# Patient Record
Sex: Female | Born: 1975 | Race: Black or African American | Hispanic: No | Marital: Married | Smoking: Current every day smoker
Health system: Southern US, Community
[De-identification: ages and names within clinical notes are randomized; demographics above are authoritative.]

## PROBLEM LIST (undated history)

## (undated) ENCOUNTER — Inpatient Hospital Stay: Payer: Self-pay

## (undated) ENCOUNTER — Inpatient Hospital Stay (HOSPITAL_COMMUNITY): Payer: Self-pay

## (undated) DIAGNOSIS — N390 Urinary tract infection, site not specified: Secondary | ICD-10-CM

## (undated) DIAGNOSIS — D689 Coagulation defect, unspecified: Secondary | ICD-10-CM

## (undated) DIAGNOSIS — D649 Anemia, unspecified: Secondary | ICD-10-CM

## (undated) DIAGNOSIS — I82409 Acute embolism and thrombosis of unspecified deep veins of unspecified lower extremity: Secondary | ICD-10-CM

## (undated) DIAGNOSIS — F309 Manic episode, unspecified: Secondary | ICD-10-CM

## (undated) HISTORY — PX: TONSILLECTOMY: SUR1361

## (undated) HISTORY — DX: Coagulation defect, unspecified: D68.9

## (undated) HISTORY — DX: Anemia, unspecified: D64.9

---

## 1998-06-09 HISTORY — PX: BREAST RECONSTRUCTION: SHX9

## 2012-03-30 ENCOUNTER — Emergency Department: Payer: Self-pay | Admitting: Emergency Medicine

## 2012-03-30 LAB — DRUG SCREEN, URINE
Amphetamines, Ur Screen: NEGATIVE (ref ?–1000)
Barbiturates, Ur Screen: NEGATIVE (ref ?–200)
Benzodiazepine, Ur Scrn: NEGATIVE (ref ?–200)
Cannabinoid 50 Ng, Ur ~~LOC~~: NEGATIVE (ref ?–50)
Cocaine Metabolite,Ur ~~LOC~~: NEGATIVE (ref ?–300)
Methadone, Ur Screen: NEGATIVE (ref ?–300)
Opiate, Ur Screen: NEGATIVE (ref ?–300)
Phencyclidine (PCP) Ur S: NEGATIVE (ref ?–25)
Tricyclic, Ur Screen: NEGATIVE (ref ?–1000)

## 2012-03-30 LAB — COMPREHENSIVE METABOLIC PANEL
Albumin: 4.3 g/dL (ref 3.4–5.0)
Alkaline Phosphatase: 80 U/L (ref 50–136)
BUN: 8 mg/dL (ref 7–18)
Bilirubin,Total: 0.3 mg/dL (ref 0.2–1.0)
Calcium, Total: 8.6 mg/dL (ref 8.5–10.1)
Chloride: 109 mmol/L — ABNORMAL HIGH (ref 98–107)
EGFR (African American): >60
Glucose: 77 mg/dL (ref 65–99)
Potassium: 3.5 mmol/L (ref 3.5–5.1)
SGOT(AST): 29 U/L (ref 15–37)
SGPT (ALT): 18 U/L (ref 12–78)
Sodium: 144 mmol/L (ref 136–145)
Total Protein: 8 g/dL (ref 6.4–8.2)

## 2012-03-30 LAB — ACETAMINOPHEN LEVEL: Acetaminophen: <2 ug/mL

## 2012-03-30 LAB — CBC
HGB: 13.6 g/dL (ref 12.0–16.0)
MCH: 33.3 pg (ref 26.0–34.0)
MCHC: 34.2 g/dL (ref 32.0–36.0)
MCV: 97 fL (ref 80–100)
Platelet: 204 10*3/uL (ref 150–440)

## 2012-03-30 LAB — TSH: Thyroid Stimulating Horm: 2.81 u[IU]/mL

## 2012-06-28 ENCOUNTER — Emergency Department: Payer: Self-pay | Admitting: Emergency Medicine

## 2012-10-07 ENCOUNTER — Ambulatory Visit: Payer: Self-pay | Admitting: Oncology

## 2012-10-23 ENCOUNTER — Inpatient Hospital Stay: Payer: Self-pay | Admitting: Internal Medicine

## 2012-10-23 LAB — CBC
HCT: 36.8 % (ref 35.0–47.0)
HGB: 12.5 g/dL (ref 12.0–16.0)
MCH: 34 pg (ref 26.0–34.0)
MCHC: 34.1 g/dL (ref 32.0–36.0)
MCV: 100 fL (ref 80–100)
RDW: 12.7 % (ref 11.5–14.5)
WBC: 8.9 10*3/uL (ref 3.6–11.0)

## 2012-10-23 LAB — COMPREHENSIVE METABOLIC PANEL
Albumin: 4.1 g/dL (ref 3.4–5.0)
Anion Gap: 13 (ref 7–16)
BUN: 10 mg/dL (ref 7–18)
Calcium, Total: 9.5 mg/dL (ref 8.5–10.1)
Co2: 24 mmol/L (ref 21–32)
Creatinine: 0.95 mg/dL (ref 0.60–1.30)
EGFR (African American): >60
Glucose: 99 mg/dL (ref 65–99)
Potassium: 3.6 mmol/L (ref 3.5–5.1)
SGOT(AST): 55 U/L — ABNORMAL HIGH (ref 15–37)
Sodium: 135 mmol/L — ABNORMAL LOW (ref 136–145)
Total Protein: 8.6 g/dL — ABNORMAL HIGH (ref 6.4–8.2)

## 2012-10-23 LAB — PROTIME-INR: Prothrombin Time: 14.4 secs (ref 11.5–14.7)

## 2012-10-23 LAB — TROPONIN I: Troponin-I: <0.02 ng/mL

## 2012-10-24 LAB — LIPID PANEL
Cholesterol: 140 mg/dL (ref 0–200)
VLDL Cholesterol, Calc: 28 mg/dL (ref 5–40)

## 2012-10-26 LAB — PROTIME-INR: INR: 1.2

## 2012-10-28 ENCOUNTER — Emergency Department: Payer: Self-pay | Admitting: Emergency Medicine

## 2012-10-28 LAB — CBC
HGB: 10.2 g/dL — ABNORMAL LOW (ref 12.0–16.0)
MCHC: 33.7 g/dL (ref 32.0–36.0)
MCV: 98 fL (ref 80–100)
Platelet: 379 10*3/uL (ref 150–440)
RBC: 3.07 10*6/uL — ABNORMAL LOW (ref 3.80–5.20)
RDW: 13 % (ref 11.5–14.5)

## 2012-10-28 LAB — PROTIME-INR: INR: 1.2

## 2012-10-28 LAB — BASIC METABOLIC PANEL
Anion Gap: 4 — ABNORMAL LOW (ref 7–16)
BUN: 7 mg/dL (ref 7–18)
Calcium, Total: 8.9 mg/dL (ref 8.5–10.1)
Chloride: 106 mmol/L (ref 98–107)
Co2: 28 mmol/L (ref 21–32)
Creatinine: 0.67 mg/dL (ref 0.60–1.30)
EGFR (African American): >60
Glucose: 121 mg/dL — ABNORMAL HIGH (ref 65–99)
Potassium: 3.7 mmol/L (ref 3.5–5.1)

## 2012-11-07 ENCOUNTER — Ambulatory Visit: Payer: Self-pay | Admitting: Oncology

## 2012-11-24 ENCOUNTER — Emergency Department: Payer: Self-pay | Admitting: Emergency Medicine

## 2012-11-24 LAB — URINALYSIS, COMPLETE
Bilirubin,UR: NEGATIVE
Glucose,UR: NEGATIVE mg/dL (ref 0–75)
Ketone: NEGATIVE
Leukocyte Esterase: NEGATIVE
Ph: 6 (ref 4.5–8.0)
Specific Gravity: 1.011 (ref 1.003–1.030)
Squamous Epithelial: 24
WBC UR: 2 /HPF (ref 0–5)

## 2012-11-24 LAB — COMPREHENSIVE METABOLIC PANEL
Alkaline Phosphatase: 110 U/L (ref 50–136)
BUN: 7 mg/dL (ref 7–18)
Bilirubin,Total: 0.4 mg/dL (ref 0.2–1.0)
Calcium, Total: 9.7 mg/dL (ref 8.5–10.1)
Chloride: 104 mmol/L (ref 98–107)
Co2: 29 mmol/L (ref 21–32)
EGFR (African American): >60
EGFR (Non-African Amer.): >60
Glucose: 78 mg/dL (ref 65–99)
Osmolality: 273 (ref 275–301)
Potassium: 3.4 mmol/L — ABNORMAL LOW (ref 3.5–5.1)
SGPT (ALT): 19 U/L (ref 12–78)
Sodium: 138 mmol/L (ref 136–145)
Total Protein: 8.5 g/dL — ABNORMAL HIGH (ref 6.4–8.2)

## 2012-11-24 LAB — CBC WITH DIFFERENTIAL/PLATELET
Eosinophil #: 0.1 10*3/uL (ref 0.0–0.7)
Eosinophil %: 2.1 %
HGB: 13.3 g/dL (ref 12.0–16.0)
Lymphocyte #: 1.2 10*3/uL (ref 1.0–3.6)
MCH: 32.2 pg (ref 26.0–34.0)
MCHC: 33.5 g/dL (ref 32.0–36.0)
MCV: 96 fL (ref 80–100)
Monocyte %: 8.5 %
Neutrophil #: 1.3 10*3/uL — ABNORMAL LOW (ref 1.4–6.5)
Neutrophil %: 46 %
Platelet: 184 10*3/uL (ref 150–440)
RBC: 4.11 10*6/uL (ref 3.80–5.20)
RDW: 14.1 % (ref 11.5–14.5)
WBC: 2.8 10*3/uL — ABNORMAL LOW (ref 3.6–11.0)

## 2012-11-24 LAB — PROTIME-INR: Prothrombin Time: 89.9 secs — ABNORMAL HIGH (ref 11.5–14.7)

## 2012-11-26 ENCOUNTER — Emergency Department: Payer: Self-pay | Admitting: Emergency Medicine

## 2012-11-26 LAB — PROTIME-INR
INR: 3.8
Prothrombin Time: 36 secs — ABNORMAL HIGH (ref 11.5–14.7)

## 2013-03-01 ENCOUNTER — Emergency Department: Payer: Self-pay | Admitting: Internal Medicine

## 2013-12-07 HISTORY — PX: DILATION AND CURETTAGE OF UTERUS: SHX78

## 2013-12-14 ENCOUNTER — Emergency Department: Payer: Self-pay | Admitting: Internal Medicine

## 2013-12-14 LAB — BASIC METABOLIC PANEL
Anion Gap: 9 (ref 7–16)
BUN: 10 mg/dL (ref 7–18)
CHLORIDE: 103 mmol/L (ref 98–107)
Calcium, Total: 8.7 mg/dL (ref 8.5–10.1)
Co2: 26 mmol/L (ref 21–32)
Creatinine: 0.75 mg/dL (ref 0.60–1.30)
EGFR (African American): >60
GLUCOSE: 79 mg/dL (ref 65–99)
OSMOLALITY: 274 (ref 275–301)
POTASSIUM: 3.7 mmol/L (ref 3.5–5.1)
SODIUM: 138 mmol/L (ref 136–145)

## 2013-12-14 LAB — HCG, QUANTITATIVE, PREGNANCY: BETA HCG, QUANT.: 54721 m[IU]/mL — AB

## 2013-12-14 LAB — CBC
HCT: 34 % — AB (ref 35.0–47.0)
HGB: 11.1 g/dL — ABNORMAL LOW (ref 12.0–16.0)
MCH: 31.9 pg (ref 26.0–34.0)
MCHC: 32.7 g/dL (ref 32.0–36.0)
MCV: 98 fL (ref 80–100)
PLATELETS: 224 10*3/uL (ref 150–440)
RBC: 3.48 10*6/uL — ABNORMAL LOW (ref 3.80–5.20)
RDW: 13.2 % (ref 11.5–14.5)
WBC: 5.1 10*3/uL (ref 3.6–11.0)

## 2014-01-07 DIAGNOSIS — I82409 Acute embolism and thrombosis of unspecified deep veins of unspecified lower extremity: Secondary | ICD-10-CM

## 2014-01-07 HISTORY — DX: Acute embolism and thrombosis of unspecified deep veins of unspecified lower extremity: I82.409

## 2014-02-03 ENCOUNTER — Emergency Department (HOSPITAL_COMMUNITY): Payer: Medicare Other

## 2014-02-03 ENCOUNTER — Encounter (HOSPITAL_COMMUNITY): Payer: Self-pay | Admitting: Emergency Medicine

## 2014-02-03 ENCOUNTER — Inpatient Hospital Stay (HOSPITAL_COMMUNITY)
Admission: EM | Admit: 2014-02-03 | Discharge: 2014-02-05 | DRG: 300 | Disposition: A | Discharge status: Home / Self Care | Payer: Medicare Other | Attending: Internal Medicine | Admitting: Internal Medicine

## 2014-02-03 DIAGNOSIS — Z791 Long term (current) use of non-steroidal anti-inflammatories (NSAID): Secondary | ICD-10-CM

## 2014-02-03 DIAGNOSIS — N390 Urinary tract infection, site not specified: Secondary | ICD-10-CM | POA: Diagnosis present

## 2014-02-03 DIAGNOSIS — M79609 Pain in unspecified limb: Secondary | ICD-10-CM | POA: Diagnosis not present

## 2014-02-03 DIAGNOSIS — M7989 Other specified soft tissue disorders: Secondary | ICD-10-CM | POA: Diagnosis not present

## 2014-02-03 DIAGNOSIS — F39 Unspecified mood [affective] disorder: Secondary | ICD-10-CM | POA: Diagnosis present

## 2014-02-03 DIAGNOSIS — F411 Generalized anxiety disorder: Secondary | ICD-10-CM | POA: Diagnosis present

## 2014-02-03 DIAGNOSIS — B961 Klebsiella pneumoniae [K. pneumoniae] as the cause of diseases classified elsewhere: Secondary | ICD-10-CM | POA: Diagnosis present

## 2014-02-03 DIAGNOSIS — I82409 Acute embolism and thrombosis of unspecified deep veins of unspecified lower extremity: Principal | ICD-10-CM | POA: Diagnosis present

## 2014-02-03 DIAGNOSIS — F17200 Nicotine dependence, unspecified, uncomplicated: Secondary | ICD-10-CM | POA: Diagnosis present

## 2014-02-03 DIAGNOSIS — I82403 Acute embolism and thrombosis of unspecified deep veins of lower extremity, bilateral: Secondary | ICD-10-CM

## 2014-02-03 DIAGNOSIS — Z79899 Other long term (current) drug therapy: Secondary | ICD-10-CM | POA: Diagnosis not present

## 2014-02-03 DIAGNOSIS — F4310 Post-traumatic stress disorder, unspecified: Secondary | ICD-10-CM | POA: Diagnosis present

## 2014-02-03 DIAGNOSIS — F410 Panic disorder [episodic paroxysmal anxiety] without agoraphobia: Secondary | ICD-10-CM | POA: Diagnosis present

## 2014-02-03 HISTORY — DX: Acute embolism and thrombosis of unspecified deep veins of unspecified lower extremity: I82.409

## 2014-02-03 LAB — CBC WITH DIFFERENTIAL/PLATELET
BASOS ABS: 0 10*3/uL (ref 0.0–0.1)
Basophils Relative: 1 % (ref 0–1)
Eosinophils Absolute: 0.1 10*3/uL (ref 0.0–0.7)
Eosinophils Relative: 2 % (ref 0–5)
HCT: 33.5 % — ABNORMAL LOW (ref 36.0–46.0)
HEMOGLOBIN: 11.2 g/dL — AB (ref 12.0–15.0)
Lymphocytes Relative: 49 % — ABNORMAL HIGH (ref 12–46)
Lymphs Abs: 2 10*3/uL (ref 0.7–4.0)
MCH: 31.1 pg (ref 26.0–34.0)
MCHC: 33.4 g/dL (ref 30.0–36.0)
MCV: 93.1 fL (ref 78.0–100.0)
Monocytes Absolute: 0.5 10*3/uL (ref 0.1–1.0)
Monocytes Relative: 11 % (ref 3–12)
Neutro Abs: 1.6 10*3/uL — ABNORMAL LOW (ref 1.7–7.7)
Neutrophils Relative %: 37 % — ABNORMAL LOW (ref 43–77)
Platelets: 308 10*3/uL (ref 150–400)
RBC: 3.6 MIL/uL — ABNORMAL LOW (ref 3.87–5.11)
RDW: 14.4 % (ref 11.5–15.5)
WBC: 4.2 10*3/uL (ref 4.0–10.5)

## 2014-02-03 LAB — BASIC METABOLIC PANEL
ANION GAP: 15 (ref 5–15)
BUN: 7 mg/dL (ref 6–23)
CALCIUM: 9.2 mg/dL (ref 8.4–10.5)
CHLORIDE: 103 meq/L (ref 96–112)
CO2: 22 meq/L (ref 19–32)
Creatinine, Ser: 0.73 mg/dL (ref 0.50–1.10)
GFR calc Af Amer: >90 mL/min (ref >90)
GFR calc non Af Amer: >90 mL/min (ref >90)
Glucose, Bld: 110 mg/dL — ABNORMAL HIGH (ref 70–99)
POTASSIUM: 4 meq/L (ref 3.7–5.3)
SODIUM: 140 meq/L (ref 137–147)

## 2014-02-03 LAB — I-STAT TROPONIN, ED: Troponin i, poc: 0 ng/mL (ref 0.00–0.08)

## 2014-02-03 LAB — PROTIME-INR
INR: 1.19 (ref 0.00–1.49)
Prothrombin Time: 15.1 seconds (ref 11.6–15.2)

## 2014-02-03 LAB — APTT: aPTT: 33 seconds (ref 24–37)

## 2014-02-03 MED ORDER — MIRTAZAPINE 15 MG PO TABS
15.0000 mg | ORAL_TABLET | Freq: Every day | ORAL | Status: DC
  Administered 2014-02-04 – 2014-02-05 (×2): 15 mg via ORAL
  Filled 2014-02-03 (×3): qty 1

## 2014-02-03 MED ORDER — HYDROXYZINE HCL 50 MG PO TABS
50.0000 mg | ORAL_TABLET | Freq: Three times a day (TID) | ORAL | Status: DC | PRN
  Administered 2014-02-04 – 2014-02-05 (×2): 50 mg via ORAL
  Filled 2014-02-03 (×4): qty 1

## 2014-02-03 MED ORDER — PRAZOSIN HCL 2 MG PO CAPS
2.0000 mg | ORAL_CAPSULE | Freq: Every day | ORAL | Status: DC
  Administered 2014-02-04 – 2014-02-05 (×2): 2 mg via ORAL
  Filled 2014-02-03 (×3): qty 1

## 2014-02-03 MED ORDER — OXYCODONE HCL 5 MG PO TABS
5.0000 mg | ORAL_TABLET | ORAL | Status: DC | PRN
  Administered 2014-02-03 – 2014-02-04 (×4): 10 mg via ORAL
  Administered 2014-02-04: 5 mg via ORAL
  Administered 2014-02-04 – 2014-02-05 (×6): 10 mg via ORAL
  Filled 2014-02-03 (×11): qty 2

## 2014-02-03 MED ORDER — ENOXAPARIN SODIUM 60 MG/0.6ML ~~LOC~~ SOLN
60.0000 mg | Freq: Two times a day (BID) | SUBCUTANEOUS | Status: DC
  Administered 2014-02-04 – 2014-02-05 (×4): 60 mg via SUBCUTANEOUS
  Filled 2014-02-03 (×5): qty 0.6

## 2014-02-03 MED ORDER — HEPARIN SODIUM (PORCINE) 5000 UNIT/ML IJ SOLN: 60.0000 [IU]/kg | Freq: Once | INTRAMUSCULAR | Status: DC

## 2014-02-03 MED ORDER — ACETAMINOPHEN 325 MG PO TABS: 650.0000 mg | ORAL_TABLET | Freq: Four times a day (QID) | ORAL | Status: DC | PRN

## 2014-02-03 MED ORDER — IOHEXOL 350 MG/ML SOLN
100.0000 mL | Freq: Once | INTRAVENOUS | Status: AC | PRN
  Administered 2014-02-03: 100 mL via INTRAVENOUS

## 2014-02-03 MED ORDER — SODIUM CHLORIDE 0.9 % IV SOLN
INTRAVENOUS | Status: DC
  Administered 2014-02-03: 16:00:00 via INTRAVENOUS

## 2014-02-03 MED ORDER — DOXEPIN HCL 100 MG PO CAPS
300.0000 mg | ORAL_CAPSULE | Freq: Every day | ORAL | Status: DC
Start: 2014-02-03 — End: 2014-02-05
  Administered 2014-02-04 – 2014-02-05 (×2): 300 mg via ORAL
  Filled 2014-02-03 (×3): qty 3

## 2014-02-03 MED ORDER — ENOXAPARIN SODIUM 60 MG/0.6ML ~~LOC~~ SOLN
60.0000 mg | Freq: Once | SUBCUTANEOUS | Status: AC
  Administered 2014-02-03: 60 mg via SUBCUTANEOUS
  Filled 2014-02-03: qty 0.6

## 2014-02-03 MED ORDER — CLONAZEPAM 1 MG PO TABS
1.0000 mg | ORAL_TABLET | Freq: Two times a day (BID) | ORAL | Status: DC
  Administered 2014-02-04 – 2014-02-05 (×4): 1 mg via ORAL
  Filled 2014-02-03 (×4): qty 1

## 2014-02-03 MED ORDER — NICOTINE 14 MG/24HR TD PT24
14.0000 mg | MEDICATED_PATCH | Freq: Every day | TRANSDERMAL | Status: DC
  Administered 2014-02-03 – 2014-02-04 (×2): 14 mg via TRANSDERMAL
  Filled 2014-02-03 (×2): qty 1

## 2014-02-03 MED ORDER — ONDANSETRON HCL 4 MG PO TABS: 4.0000 mg | ORAL_TABLET | Freq: Four times a day (QID) | ORAL | Status: DC | PRN

## 2014-02-03 MED ORDER — ONDANSETRON HCL 4 MG/2ML IJ SOLN: 4.0000 mg | Freq: Four times a day (QID) | INTRAMUSCULAR | Status: DC | PRN

## 2014-02-03 MED ORDER — HYDROXYZINE PAMOATE 50 MG PO CAPS
50.0000 mg | ORAL_CAPSULE | Freq: Three times a day (TID) | ORAL | Status: DC | PRN
  Filled 2014-02-03: qty 1

## 2014-02-03 MED ORDER — HYDROMORPHONE HCL PF 1 MG/ML IJ SOLN
1.0000 mg | Freq: Once | INTRAMUSCULAR | Status: AC
  Administered 2014-02-03: 1 mg via INTRAVENOUS
  Filled 2014-02-03: qty 1

## 2014-02-03 MED ORDER — SODIUM CHLORIDE 0.9 % IV SOLN: INTRAVENOUS | Status: AC

## 2014-02-03 MED ORDER — MORPHINE SULFATE 4 MG/ML IJ SOLN
4.0000 mg | Freq: Once | INTRAMUSCULAR | Status: DC
  Filled 2014-02-03: qty 1

## 2014-02-03 MED ORDER — ACETAMINOPHEN 650 MG RE SUPP: 650.0000 mg | Freq: Four times a day (QID) | RECTAL | Status: DC | PRN

## 2014-02-03 MED ORDER — HYDROCODONE-ACETAMINOPHEN 5-325 MG PO TABS
1.0000 | ORAL_TABLET | ORAL | Status: DC | PRN
  Administered 2014-02-03: 2 via ORAL
  Filled 2014-02-03: qty 2

## 2014-02-03 NOTE — Progress Notes (Addendum)
ANTICOAGULATION CONSULT NOTE - Initial Consult  Pharmacy Consult for heparin Indication: Acute DVT (with history of DVT)  No Known Allergies  Patient Measurements: Height:  (167.6 cm) Weight: 130 lb (58.968 kg) IBW/kg (Calculated) : 59.3 Heparin Dosing Weight: 59kg  Vital Signs: Temp: 98.2 F (36.8 C) (08/28 1207) Temp src: Oral (08/28 1207) BP: 112/74 mmHg (08/28 1526) Pulse Rate: 100 (08/28 1526)  Labs:  Recent Labs  02/03/14 1517  HGB 11.2*  HCT 33.5*  PLT 308    CrCl is unknown because no creatinine reading has been taken.   Medical History: Past Medical History  Diagnosis Date  . DVT (deep venous thrombosis) 01/2014   Assessment: 38 YOF presents with BL calf pain and found to have BL LE DVT.   She has history of DVTs and discontinued anticoagulation after finding out she was [redacted] weeks pregnant.  She miscarried and had D&C July 14th in [place]. Per patient, she has "intermittently" resumed her apixaban  PO BID following miscarriage. She was recently discharged from a [place], [place] hospital for DVT and drove to [facility] 8/24. Last dose of apixaban on 8/27. Orders for pharmacy to start heparin gtt, now changed to LMWH    CBC acceptable to start heparin  SCr = 0.73, CrCl >64ml/min  Goal of Therapy:  Heparin level 0.3-0.7 units/ml Monitor platelets by anticoagulation protocol: Yes   Plan:   Lovenox  SQ q12h ( /kg)  CBC minimum q72h while in hospital on LMWH  Check baseline PT/INR, aPTT  Signed/held order for eliquis - spoke with Dr. Elisabeth Pigeon - Orders to discontinue  If still trying to become pregnant, LMWH likely drug of choice  Juliette Alcide, PharmD, BCPS.   Pager: [phone]  02/03/2014,3:43 PM

## 2014-02-03 NOTE — ED Notes (Signed)
Pt reports hx of blood clots; was on blood thinners and stopped when she found out she was [redacted] weeks pregnant. Pt report D&C of pregnancy in July. Pt reports recent discharge for DVT bilaterally. Pt reports on blood thinners for past 5-6 days. Pt tearful.

## 2014-02-03 NOTE — H&P (Signed)
[facility] History and Physical  Jaclyn Dougherty:[id number] DOB: 1975-10-06 DOA: 02/03/2014  Referring physician: ER physician PCP: No primary provider on file.   Chief Complaint: calf pain, leg swelling  HPI:  38 year old female with past medical history of right lower extremity DVT in 09/2013 was on anticoagulation but subsequently stopped, then left lower extremity DVT in 11/2013 was on apixaban. Then she found out she was pregnant in 12/2013 and she stopped apixaban. She subsequently has spontaneous abortion. She recently traveled and was sitting for about 4 hours. She noticed her calf muscles were hurting and legs were more swollen. No shortness of breath, no chest pain, no palpitations. No abdominal pain, nausea or vomiting. No blood in stool or urine. No falls or lightheadedness or loss of consciousness. In ED, vitals were stable. She was found to have bilateral DVT. She was started on Lovenox for anticoagulation.   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 Pt mother had history of blood clot first diagnosed at age 38. Pt will need lifelong treatment with anticoagulation.   Now on Lovenox since admission but pt prefers to be on apixaban.  No evidence of pulmonary embolism on CT angio chest    Radiological Exams on Admission: Ct Angio Chest Pe W/cm &/or Wo Cm  02/03/2014    IMPRESSION: No evidence of pulmonary embolus.  No acute findings.     Code Status: Full Family Communication: Plan of care discussed with the patient  Disposition Plan: Admit for further evaluation  Manson Passey, MD  [facility] Pager [phone]  Review of Systems:  Constitutional: Negative for fever, chills and malaise/fatigue. Negative for diaphoresis.  HENT: Negative for hearing loss, ear pain, nosebleeds, congestion, sore throat, neck pain, tinnitus and ear discharge.   Eyes: Negative for blurred vision, double vision,  photophobia, pain, discharge and redness.  Respiratory: Negative for cough, hemoptysis, sputum production, shortness of breath, wheezing and stridor.   Cardiovascular: Negative for chest pain, palpitations, orthopnea, claudication.  Gastrointestinal: Negative for nausea, vomiting and abdominal pain. Negative for heartburn, constipation, blood in stool and melena.  Genitourinary: Negative for dysuria, urgency, frequency, hematuria and flank pain.  Musculoskeletal: Negative for myalgias, back pain, joint pain and falls.  Skin: Negative for itching and rash.  Neurological: Negative for dizziness and weakness. Negative for tingling, tremors, sensory change, speech change, focal weakness, loss of consciousness and headaches.  Endo/Heme/Allergies: Negative for environmental allergies and polydipsia. Does not bruise/bleed easily.  Psychiatric/Behavioral: Negative for suicidal ideas. The patient is not nervous/anxious.      Past Medical History  Diagnosis Date  . DVT (deep venous thrombosis) 01/2014   Past Surgical History  Procedure Laterality Date  . Tonsillectomy    . Breast reconstruction Bilateral 2000    breast augumentation   Social History:  reports that she has been smoking Cigarettes.  She has been smoking about 0.00 packs per day. She does not have any smokeless tobacco history on file. She reports that she drinks alcohol. She reports that she does not use illicit drugs.  No Known Allergies  Family History: mother blood clot at age 7.   Prior to Admission medications   Medication Sig Start Date End Date Taking? Authorizing Provider  apixaban (ELIQUIS) 5 MG TABS tablet Take 5 mg by mouth 2 (two) times daily.   Yes Historical Provider, MD  clonazePAM (KLONOPIN) 1 MG tablet Take 1 mg by mouth 2 (two) times daily.  Yes Historical Provider, MD  doxepin (SINEQUAN) 150 MG capsule Take 300 mg by mouth at bedtime.   Yes Historical Provider, MD  hydrOXYzine (VISTARIL) 50 MG capsule Take  50 mg by mouth 3 (three) times daily as needed for anxiety.   Yes Historical Provider, MD  ibuprofen (ADVIL,MOTRIN) 200 MG tablet Take 600 mg by mouth every 4 (four) hours as needed for moderate pain or cramping.   Yes Historical Provider, MD  mirtazapine (REMERON) 15 MG tablet Take 15 mg by mouth at bedtime.   Yes Historical Provider, MD  prazosin (MINIPRESS) 2 MG capsule Take 2 mg by mouth at bedtime.   Yes Historical Provider, MD   Physical Exam: Filed Vitals:   02/03/14 1452 02/03/14 1526 02/03/14 1701 02/03/14 1758  BP: 124/71 112/74 116/59 126/78  Pulse: 98 100 78 74  Temp:   98.5 F (36.9 C) 98.9 F (37.2 C)  TempSrc:   Oral Oral  Resp: Height:   (1.676 m)   (1.676 m)  Weight:  58.968 kg (130 lb)  58.968 kg (130 lb)  SpO2: 99% 100% 100% 100%    Physical Exam  Constitutional: Appears well-developed and well-nourished. No distress.  HENT: Normocephalic. No tonsillar erythema or exudates Eyes: Conjunctivae and EOM are normal. PERRLA, no scleral icterus.  Neck: Normal ROM. Neck supple. No JVD. No tracheal deviation. No thyromegaly.  CVS: RRR, S1/S2 +, no murmurs, no gallops, no carotid bruit.  Pulmonary: Effort and breath sounds normal, no stridor, rhonchi, wheezes, rales.  Abdominal: Soft. BS +,  no distension, tenderness, rebound or guarding.  Musculoskeletal: Normal range of motion. LE swelling appreciated .  Lymphadenopathy: No lymphadenopathy noted, cervical, inguinal. Neuro: Alert. Normal reflexes, muscle tone coordination. No focal neurologic deficits. Skin: Skin is warm and dry. No rash noted. Not diaphoretic. No erythema. No pallor.  Psychiatric: Normal mood and affect. Behavior, judgment, thought content normal.   Labs on Admission:  Basic Metabolic Panel:  Recent Labs Lab 02/03/14 1517  NA 140  K 4.0  CL 103  CO2 22  GLUCOSE 110*  BUN 7  CREATININE 0.73  CALCIUM 9.2   Liver Function Tests: No results found for this basename: AST,  ALT, ALKPHOS, BILITOT, PROT, ALBUMIN,  in the last 168 hours No results found for this basename: LIPASE, AMYLASE,  in the last 168 hours No results found for this basename: AMMONIA,  in the last 168 hours CBC:  Recent Labs Lab 02/03/14 1517  WBC 4.2  NEUTROABS 1.6*  HGB 11.2*  HCT 33.5*  MCV 93.1  PLT 308   Cardiac Enzymes: No results found for this basename: CKTOTAL, CKMB, CKMBINDEX, TROPONINI,  in the last 168 hours BNP: No components found with this basename: POCBNP,  CBG: No results found for this basename: GLUCAP,  in the last 168 hours  If 7PM-7AM, please contact night-coverage [email or web address] Password [facility] 02/03/2014, 6:07 PM

## 2014-02-03 NOTE — ED Notes (Signed)
Pt is having swelling and pain in left calf. Pt is having pain in right leg behind her knee which is where she states hurts when she has DVT's. Pt has history of DVT and was just discharged 2 days ago in [place]. She made a 9 hour car ride yesterday to come to [facility] from [place]. States she has been having SOB but does not currently have SOB. VS stable.

## 2014-02-03 NOTE — ED Provider Notes (Signed)
CSN: [id number]     Arrival date & time 02/03/14  1150 History   First MD Initiated Contact with Patient 02/03/14 1206     No chief complaint on file.    (Consider location/radiation/quality/duration/timing/severity/associated sxs/prior Treatment) HPI This patient has history of prior DVT.  She was recently hospitalized in [place] for DVT. She drove to the area to visit family. She now has pain behind both knees, right greater than left. However she notes some swelling of the left knee. Patient reports she is taking Eliquis. Patient denies CP, endorses SOB. Patient reports the symptoms are worse than they were previously when she was hospitalized. She reports that she was told to return to the hospital should her symptoms worsen. She denies that she previously had pulmonary embolism in association with her clots. She had a pregnancy in July at which point in time she had stopped taking any anticoagulants. She lost that pregnancy and since that point in time has had intermittent use of anticoagulants. The patient does not know the underlying cause of her clots. She is very tearful and upset and reports that she needs to know why this is happening to her. No past medical history on file. No past surgical history on file. No family history on file. History  Substance Use Topics  . Smoking status: Not on file  . Smokeless tobacco: Not on file  . Alcohol Use: Not on file   OB History   No data available     Review of Systems  Constitutional: Negative for fever and chills.  HENT: Negative for congestion and sore throat.   Respiratory: Positive for shortness of breath. Negative for cough.   Cardiovascular: Negative for chest pain and leg swelling.  Gastrointestinal: Negative for vomiting, abdominal pain and diarrhea.  Genitourinary: Negative for dysuria and difficulty urinating.  Musculoskeletal: Positive for arthralgias and myalgias.  Skin: Negative for color change and rash.   Neurological: Negative for dizziness, weakness and headaches.  Psychiatric/Behavioral: Negative for confusion and agitation.      Allergies  Review of patient's allergies indicates not on file.  Home Medications   Prior to Admission medications   Not on File   There were no vitals taken for this visit. Physical Exam  Constitutional: She is oriented to person, place, and time. She appears well-developed and well-nourished.  HENT:  Head: Normocephalic and atraumatic.  Eyes: EOM are normal. Pupils are equal, round, and reactive to light.  Neck: Neck supple.  Cardiovascular: Normal rate, regular rhythm, normal heart sounds and intact distal pulses.   Pulmonary/Chest: Effort normal and breath sounds normal.  Abdominal: Soft. Bowel sounds are normal. She exhibits no distension. There is no tenderness.  Musculoskeletal: Normal range of motion. She exhibits no edema. Tenderness: endorses tenderness in the popliteal fossa bilaterally. Calves are soft no significant rib tenderness to palpation. possible mild.  Neurological: She is alert and oriented to person, place, and time. She has normal strength. Coordination normal. GCS eye subscore is 4. GCS verbal subscore is 5. GCS motor subscore is 6.  Skin: Skin is warm, dry and intact.  Psychiatric: She has a normal mood and affect.   correction to above that is not supposed to be rib tenderness. There is mild possible effusion of the left knee joint this is not of significant pain the patient she has normal range of motion. There is no erythema or warmth to the joint.   ED Course  Procedures (including critical care time) Labs Review Labs Reviewed  CBC WITH DIFFERENTIAL - Abnormal; Notable for the following:    RBC 3.60 (*)    Hemoglobin 11.2 (*)    HCT 33.5 (*)    Neutrophils Relative % 37 (*)    Neutro Abs 1.6 (*)    Lymphocytes Relative 49 (*)    All other components within normal limits  BASIC METABOLIC PANEL  APTT  PROTIME-INR   I-STAT TROPOININ, ED    Imaging Review No results found.   EKG Interpretation None     CRITICAL CARE Performed by: Arby Barrette   Total critical care time: 30 MIN  Critical care time was exclusive of separately billable procedures and treating other patients.  Critical care was necessary to treat or prevent imminent or life-threatening deterioration.  Critical care was time spent personally by me on the following activities: development of treatment plan with patient and/or surrogate as well as nursing, discussions with consultants, evaluation of patient's response to treatment, examination of patient, obtaining history from patient or surrogate, ordering and performing treatments and interventions, ordering and review of laboratory studies, ordering and review of radiographic studies, pulse oximetry and re-evaluation of patient's condition.   MDM   Final diagnoses:  DVT (deep venous thrombosis), bilateral    The patient is admitted with bilateral DVT, heparin has been initiated.   Arby Barrette, MD 02/03/14 [phone]

## 2014-02-03 NOTE — ED Notes (Signed)
Ultrasound at bedside. MD notified of patient pain 10/10.

## 2014-02-03 NOTE — ED Notes (Signed)
Social work at bedside.  

## 2014-02-03 NOTE — Progress Notes (Addendum)
Bilateral lower extremity venous duplex completed.  There appears to be DVT in the saphenofemoral junction, common femoral vein, and femoral vein bilaterally.  No evidence of superficial thrombosis, or Baker's cyst.

## 2014-02-03 NOTE — Progress Notes (Signed)
Utilization Review completed.  Vong Garringer RN CM  
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
## 2014-02-03 NOTE — ED Notes (Signed)
Pt resting comfortably. Still complaining of bilateral leg pain 10/10.

## 2014-02-03 NOTE — ED Notes (Signed)
Pt states she was recently pregnant and miscarried in July. She was off of her anticoagulant then but is now back on them.

## 2014-02-03 NOTE — Progress Notes (Signed)
02/03/2014 A. Euna Armon RNCM 1655pm EDCM spoke to patient at bedside.  Patient reports she is from [place] and is here visiting.  Patient is unsure of how long she will be staying but reports she may be "Headed up [place] by the third."  Patient reports she is "staying around" the [place] area.  Patient with Medicare insurance.  [facility] provided patient with list of pcpc who accept Medicare insurance within a 25 mile radius of [place] zip code [address] and placed in patient's belongings bag.  Patient became tearful in the room.  [facility] provided patient support.  Patient thankful for EDCM's concern.  EDSW consulted.

## 2014-02-03 NOTE — ED Notes (Signed)
Pt transported to CT ?
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
## 2014-02-03 NOTE — ED Notes (Addendum)
Pt reports to ED with complaints of bilateral calf pain. Pt states she has a history of clots. Pt states this pain in her legs is similar to previous clot experiences. She was recently treated at a hospital in [place], [place] on Monday this past week. States she was placed on heparin during her admission. Legs are swollen bilaterally, non pitting edema. Pt complains of right leg hurting the worst with pain starting behind the knee and radiating down the calf. Right knee is swollen compared to left knee. Left ankle is swollen +2 nonpitting edema. Left ankle is also slightly red.

## 2014-02-04 DIAGNOSIS — N390 Urinary tract infection, site not specified: Secondary | ICD-10-CM

## 2014-02-04 LAB — URINALYSIS, ROUTINE W REFLEX MICROSCOPIC
Bilirubin Urine: NEGATIVE
GLUCOSE, UA: NEGATIVE mg/dL
KETONES UR: NEGATIVE mg/dL
NITRITE: POSITIVE — AB
PH: 6.5 (ref 5.0–8.0)
Protein, ur: NEGATIVE mg/dL
Specific Gravity, Urine: 1.008 (ref 1.005–1.030)
Urobilinogen, UA: 1 mg/dL (ref 0.0–1.0)

## 2014-02-04 LAB — URINE MICROSCOPIC-ADD ON

## 2014-02-04 LAB — COMPREHENSIVE METABOLIC PANEL WITH GFR
ALT: 79 U/L — ABNORMAL HIGH (ref 0–35)
AST: 139 U/L — ABNORMAL HIGH (ref 0–37)
Albumin: 3.6 g/dL (ref 3.5–5.2)
Alkaline Phosphatase: 70 U/L (ref 39–117)
Anion gap: 12 (ref 5–15)
BUN: 6 mg/dL (ref 6–23)
CO2: 25 meq/L (ref 19–32)
Calcium: 9.1 mg/dL (ref 8.4–10.5)
Chloride: 106 meq/L (ref 96–112)
Creatinine, Ser: 0.73 mg/dL (ref 0.50–1.10)
GFR calc Af Amer: >90 mL/min (ref >90)
GFR calc non Af Amer: >90 mL/min (ref >90)
Glucose, Bld: 92 mg/dL (ref 70–99)
Potassium: 4 meq/L (ref 3.7–5.3)
Sodium: 143 meq/L (ref 137–147)
Total Bilirubin: 0.4 mg/dL (ref 0.3–1.2)
Total Protein: 6.8 g/dL (ref 6.0–8.3)

## 2014-02-04 LAB — CBC
HCT: 32.1 % — ABNORMAL LOW (ref 36.0–46.0)
Hemoglobin: 10.5 g/dL — ABNORMAL LOW (ref 12.0–15.0)
MCH: 30.4 pg (ref 26.0–34.0)
MCHC: 32.7 g/dL (ref 30.0–36.0)
MCV: 93 fL (ref 78.0–100.0)
Platelets: 271 K/uL (ref 150–400)
RBC: 3.45 MIL/uL — ABNORMAL LOW (ref 3.87–5.11)
RDW: 14.3 % (ref 11.5–15.5)
WBC: 4.6 K/uL (ref 4.0–10.5)

## 2014-02-04 LAB — GLUCOSE, CAPILLARY: Glucose-Capillary: 138 mg/dL — ABNORMAL HIGH (ref 70–99)

## 2014-02-04 LAB — PROTIME-INR
INR: 1.08 (ref 0.00–1.49)
Prothrombin Time: 14 s (ref 11.6–15.2)

## 2014-02-04 LAB — HOMOCYSTEINE: HOMOCYSTEINE-NORM: 8.9 umol/L (ref 4.0–15.4)

## 2014-02-04 LAB — APTT: aPTT: 31 s (ref 24–37)

## 2014-02-04 LAB — ANTITHROMBIN III: ANTITHROMB III FUNC: 73 % — AB (ref 75–120)

## 2014-02-04 MED ORDER — LORAZEPAM 2 MG/ML IJ SOLN: 1.0000 mg | Freq: Three times a day (TID) | INTRAMUSCULAR | Status: DC | PRN

## 2014-02-04 MED ORDER — LORAZEPAM 2 MG/ML IJ SOLN
INTRAMUSCULAR | Status: AC
  Administered 2014-02-04: 1 mg
  Filled 2014-02-04: qty 1

## 2014-02-04 MED ORDER — NICOTINE 21 MG/24HR TD PT24: 21.0000 mg | MEDICATED_PATCH | Freq: Every day | TRANSDERMAL | Status: DC

## 2014-02-04 MED ORDER — NICOTINE 21 MG/24HR TD PT24
21.0000 mg | MEDICATED_PATCH | Freq: Every day | TRANSDERMAL | Status: DC
  Administered 2014-02-04 – 2014-02-05 (×2): 21 mg via TRANSDERMAL
  Filled 2014-02-04 (×2): qty 1

## 2014-02-04 MED ORDER — LORAZEPAM 2 MG/ML IJ SOLN
1.0000 mg | Freq: Once | INTRAMUSCULAR | Status: AC
  Administered 2014-02-04: 1 mg via INTRAVENOUS

## 2014-02-04 MED ORDER — CIPROFLOXACIN IN D5W 200 MG/100ML IV SOLN
200.0000 mg | Freq: Two times a day (BID) | INTRAVENOUS | Status: DC
  Administered 2014-02-04 – 2014-02-05 (×3): 200 mg via INTRAVENOUS
  Filled 2014-02-04 (×4): qty 100

## 2014-02-04 NOTE — Progress Notes (Signed)
Patient ID: Jaclyn Dougherty, female   DOB: 07-12-1975, 38 y.o.   MRN: [id number] [facility] PROGRESS NOTE  Jaclyn Dougherty WUJ:[id number] DOB: [birth year] DOA: 02/03/2014 PCP: No primary provider on file.  Brief narrative: 38 year old female with past medical history of right lower extremity DVT in 09/2013 was on anticoagulation but subsequently stopped, then left lower extremity DVT in 11/2013 was on apixaban. Then she found out she was pregnant in 12/2013 and she stopped apixaban. She subsequently had spontaneous abortion. She recently traveled and was sitting for about 4 hours. She noticed her calf muscles were hurting and legs were more swollen. No shortness of breath, no chest pain, no palpitations. In ED, vitals were stable. She was found to have bilateral DVT, no pulmonary embolism. She was started on Lovenox for anticoagulation.   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 Pt mother had history of blood clot first diagnosed at age 54. Pt will need lifelong treatment with anticoagulation.  Follow up on genetic panel Now on Lovenox subQ. No evidence of pulmonary embolism on CT angio chest  UTI  Started cipro    Code Status: Full.  Family Communication:  plan of care discussed with the patient Disposition Plan: Home when stable.    IV Access:   Peripheral IV Procedures and diagnostic studies:    Ct Angio Chest Pe W/cm &/or Wo Cm 02/03/2014  No evidence of pulmonary embolus.  No acute findings.     Medical Consultants:   None  Other Consultants:   None  Anti-Infectives:   Cipro 02/04/2014 -->   Manson Passey, MD  [facility] Pager [phone]  If 7PM-7AM, please contact night-coverage [email or web address] Password [facility] 02/04/2014, 6:21 PM   LOS: 1 day    HPI/Subjective: No acute overnight events. Anxious, very emotional. Talked about wantign to have another baby but afraid due to blood clots and need to  take medication. She already has 4 kids who are not in her custody as she suffered nervous breakdown and they were taken away from her.  Objective: Filed Vitals:   02/03/14 1758 02/03/14 2115 02/04/14 0510 02/04/14 1355  BP: 126/78 116/62 107/64 118/77  Pulse: 74 103 84 91  Temp: 98.9 F (37.2 C) 98.5 F (36.9 C) 98.5 F (36.9 C) 98.8 F (37.1 C)  TempSrc: Oral Oral Oral Oral  Resp: Height:  (1.676 m)     Weight: 58.968 kg (130 lb)     SpO2: 100% 100% 100% 100%    Intake/Output Summary (Last 24 hours) at 02/04/14 1821 Last data filed at 02/04/14 0900  Gross per 24 hour  Intake    120 ml  Output      0 ml  Net    120 ml    Exam:   General:  Pt is alert, follows commands appropriately, not in acute distress  Cardiovascular: Regular rate and rhythm, S1/S2, no murmurs  Respiratory: Clear to auscultation bilaterally, no wheezing, no crackles, no rhonchi  Abdomen: Soft, non tender, non distended, bowel sounds present  Extremities:  Swelling in legs, pulses DP and PT palpable bilaterally  Neuro: Grossly nonfocal  Data Reviewed: Basic Metabolic Panel:  Recent Labs Lab 02/03/14 1517 02/04/14 0500  NA 140 143  K 4.0 4.0  CL 103 106  CO2 22 25  GLUCOSE 110* 92  BUN 7 6  CREATININE 0.73 0.73  CALCIUM 9.2 9.1   Liver Function Tests:  Recent Labs Lab 02/04/14 0500  AST 139*  ALT 79*  ALKPHOS 70  BILITOT 0.4  PROT 6.8  ALBUMIN 3.6   No results found for this basename: LIPASE, AMYLASE,  in the last 168 hours No results found for this basename: AMMONIA,  in the last 168 hours CBC:  Recent Labs Lab 02/03/14 1517 02/04/14 0500  WBC 4.2 4.6  NEUTROABS 1.6*  --   HGB 11.2* 10.5*  HCT 33.5* 32.1*  MCV 93.1 93.0  PLT 308 271   Cardiac Enzymes: No results found for this basename: CKTOTAL, CKMB, CKMBINDEX, TROPONINI,  in the last 168 hours BNP: No components found with this basename: POCBNP,  CBG:  Recent Labs Lab 02/04/14 0731   GLUCAP 138*    No results found for this or any previous visit (from the past 240 hour(s)).   Scheduled Meds: . ciprofloxacin  200 mg Intravenous Q12H  . clonazePAM  1 mg Oral BID  . doxepin  300 mg Oral QHS  . enoxaparin (LOVENOX) injection  60 mg Subcutaneous Q12H  . mirtazapine  15 mg Oral QHS  . nicotine  21 mg Transdermal Daily  . prazosin  2 mg Oral QHS   Continuous Infusions:
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
## 2014-02-04 NOTE — Progress Notes (Signed)
RN called into room, patient reports having anxiety attack.  Patient observed in bed writhing around, stating i can't breath, don't touch me I am having a panic attack."  MD notified and order for IV ativan obtained.  1 mg IV ativan administered and patient observed to relax and calm down.  States she is feeling a little better. Will continue to monitor.

## 2014-02-05 DIAGNOSIS — F39 Unspecified mood [affective] disorder: Secondary | ICD-10-CM

## 2014-02-05 DIAGNOSIS — F411 Generalized anxiety disorder: Secondary | ICD-10-CM

## 2014-02-05 LAB — GLUCOSE, CAPILLARY: GLUCOSE-CAPILLARY: 103 mg/dL — AB (ref 70–99)

## 2014-02-05 MED ORDER — CIPROFLOXACIN HCL 500 MG PO TABS: 500.0000 mg | ORAL_TABLET | Freq: Two times a day (BID) | ORAL | Status: DC

## 2014-02-05 MED ORDER — CLONAZEPAM 1 MG PO TABS
1.0000 mg | ORAL_TABLET | Freq: Three times a day (TID) | ORAL | Status: DC
  Administered 2014-02-05: 1 mg via ORAL
  Filled 2014-02-05: qty 1

## 2014-02-05 MED ORDER — CLONAZEPAM 1 MG PO TABS: 1.0000 mg | ORAL_TABLET | Freq: Three times a day (TID) | ORAL | Status: DC

## 2014-02-05 MED ORDER — ENOXAPARIN SODIUM 60 MG/0.6ML ~~LOC~~ SOLN: 60.0000 mg | Freq: Two times a day (BID) | SUBCUTANEOUS | Status: DC

## 2014-02-05 NOTE — Progress Notes (Signed)
CARE MANAGEMENT NOTE 02/05/2014  Patient:  Jaclyn Dougherty, Jaclyn Dougherty   Account Number:  [id number]  Date Initiated:  02/05/2014  Documentation initiated by:  [facility]  Subjective/Objective Assessment:   DVT     Action/Plan:   plans to move back to [place], has [place] Medicaid   Anticipated DC Date:  02/07/2014   Anticipated DC Plan:  HOME/SELF CARE      DC Planning Services  CM consult      Choice offered to / List presented to:             Status of service:  In process, will continue to follow Medicare Important Message given?   (If response is "NO", the following Medicare IM given date fields will be blank) Date Medicare IM given:   Medicare IM given by:   Date Additional Medicare IM given:   Additional Medicare IM given by:    Discharge Disposition:    Per UR Regulation:    If discussed at Long Length of Stay Meetings, dates discussed:    Comments:  02/05/2014 1500 NCM spoke to pt and gave her information on [facility]. States she can have a follow up at clinic post dc until she finds a PCP in [place]. Weekday NCM to arrange appt with [facility] for follow up. Pt not sure when she will return to [place]. She has [place] Medicaid that will cover medications. Explained Lovenox is a preferred Medicaid med. NCM will continue to follow up for dc needs. Isidoro Donning RN CCM Case Mgmt phone [phone]

## 2014-02-05 NOTE — Progress Notes (Signed)
Patient discharged home, all discharge medications and instructions reviewed and questions answered. Patient to be assisted to vehicle by wheelchair.  
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
## 2014-02-05 NOTE — Discharge Summary (Signed)
Physician Discharge Summary  Jaclyn Dougherty FAO:[id number] DOB: [birth year] DOA: 02/03/2014  PCP: No primary provider on file.  Admit date: 02/03/2014 Discharge date: 02/05/2014  Recommendations for Outpatient Follow-up:  1. Urine culture grew Klebsiella and sensitive to cipro which was prescribed at the time of discharge.  2. Pt will continue twice daily Lovenox as prescribed, indefinitely. 3. Follow up with PCP in 1-2 weeks after discharge to make sure symptoms are controlled.  Discharge Diagnoses:  Active Problems:   DVT (deep venous thrombosis)    Discharge Condition: stable   Diet recommendation: as tolerated   History of present illness:  38 year old female with past medical history of right lower extremity DVT in 09/2013 was on anticoagulation but subsequently stopped, then left lower extremity DVT in 11/2013 was on apixaban. Then she found out she was pregnant in 12/2013 and she stopped apixaban. She subsequently had spontaneous abortion. She recently traveled and was sitting for about 4 hours. She noticed her calf muscles were hurting and legs were more swollen. No shortness of breath, no chest pain, no palpitations.  In ED, vitals were stable. She was found to have bilateral DVT, no pulmonary embolism. She was started on Lovenox for anticoagulation.   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 Pt mother had history of blood clot first diagnosed at age 21. Pt will need lifelong treatment with anticoagulation.  So far, genetic panel unrevealing  Continue Lovenox as prescribed 60 mg Q 12 hours.  No evidence of pulmonary embolism on CT angio chest  UTI  Secondary to Klebsiella Pneumoniae Started cipro which pt will continue on discharge as prescribed.   Code Status: Full.  Family Communication: plan of care discussed with the patient   IV Access:   Peripheral IV Procedures and diagnostic studies:    Ct Angio Chest  Pe W/cm &/or Wo Cm 02/03/2014 No evidence of pulmonary embolus. No acute findings.   Medical Consultants:   None   Other Consultants:   None   Anti-Infectives:   Cipro 02/04/2014 --> continue on discharge as prescribed    Signed:  Manson Passey, MD  [facility] 02/05/2014, 3:43 PM  Pager #: [phone]   Discharge Exam: Filed Vitals:   02/05/14 1345  BP: 106/68  Pulse: 82  Temp: 97.9 F (36.6 C)  Resp: 16   Filed Vitals:   02/04/14 1355 02/04/14 2111 02/05/14 0536 02/05/14 1345  BP: 118/77 117/70 106/66 106/68  Pulse: 91 86 80 82  Temp: 98.8 F (37.1 C) 98.8 F (37.1 C) 97.9 F (36.6 C) 97.9 F (36.6 C)  TempSrc: Oral Oral Axillary Oral  Resp: Height:      Weight:      SpO2: 100% 99% 99% 100%    General: Pt is alert, follows commands appropriately, not in acute distress Cardiovascular: Regular rate and rhythm, S1/S2 +, no murmurs Respiratory: Clear to auscultation bilaterally, no wheezing, no crackles, no rhonchi Abdominal: Soft, non tender, non distended, bowel sounds +, no guarding Extremities: no edema, no cyanosis, pulses palpable bilaterally DP and PT Neuro: Grossly nonfocal  Discharge Instructions  Discharge Instructions   Call MD for:  difficulty breathing, headache or visual disturbances    Complete by:  As directed      Call MD for:  persistant dizziness or light-headedness    Complete by:  As directed      Call MD for:  severe uncontrolled pain  Complete by:  As directed      Diet - low sodium heart healthy    Complete by:  As directed      Discharge instructions    Complete by:  As directed   1. Continue Lovenox sub Q indefinitely twice a day regimen per pharmacy recommendations. 2. You were cared for by Dr. Manson Passey (a hospitalist) during your hospital stay. If you have any questions about your discharge medications or the care you received while you were in the hospital after you are discharged, you can call the unit  and ask to speak with the hospitalist on call if the hospitalist that took care of you is not available. Once you are discharged, your primary care physician will handle any further medical issues. Please note that NO REFILLS for any discharge medications will be authorized once you are discharged, as it is imperative that you return to your primary care physician (or establish a relationship with a primary care physician if you do not have one) for your aftercare needs so that they can reassess your need for medications and monitor your lab values. Any outstanding tests can be reviewed by your PCP at your follow up visit. It is also important to review any medicine changes with your PCP. Please bring these d/c instructions with you to your next visit so your physician can review these changes with you.  If you do not have a primary care physician, you can call [phone] for a physician referral. It is highly recommended that you obtain a PCP for hospital follow up.     Increase activity slowly    Complete by:  As directed             Medication List          The results of significant diagnostics from this hospitalization (including imaging, microbiology, ancillary and laboratory) are listed below for reference.    Medication List    STOP taking these medications       ELIQUIS 5 MG Tabs tablet  Generic drug:  apixaban      TAKE these medications       ciprofloxacin 500 MG tablet  Commonly known as:  CIPRO  Take 1 tablet (500 mg total) by mouth 2 (two) times daily.     clonazePAM 1 MG tablet  Commonly known as:  KLONOPIN  Take 1 tablet (1 mg total) by mouth 3 (three) times daily.     doxepin 150 MG capsule  Commonly known as:  SINEQUAN  Take 300 mg by mouth at bedtime.     enoxaparin 60 MG/0.6ML injection  Commonly known as:  LOVENOX  Inject 0.6 mLs (60 mg total) into the skin every 12 (twelve) hours.     hydrOXYzine 50 MG capsule  Commonly known as:  VISTARIL  Take 50 mg  by mouth 3 (three) times daily as needed for anxiety.     ibuprofen 200 MG tablet  Commonly known as:  ADVIL,MOTRIN  Take 600 mg by mouth every 4 (four) hours as needed for moderate pain or cramping.     mirtazapine 15 MG tablet  Commonly known as:  REMERON  Take 15 mg by mouth at bedtime.     prazosin 2 MG capsule  Commonly known as:  MINIPRESS  Take 2 mg by mouth at bedtime.          Significant Diagnostic Studies: Ct Angio Chest Pe W/cm &/or Wo Cm 02/03/2014   No evidence of  pulmonary embolus.  No acute findings.     Microbiology: Recent Results (from the past 240 hour(s))  URINE CULTURE     Status: None   Collection Time    02/04/14 10:04 AM      Result Value Ref Range Status   Specimen Description URINE, CLEAN CATCH   Final   Special Requests Normal   Final   Culture  Setup Time     Final   Value: 02/04/2014 15:25     Performed at [facility] Count     Final   Value: >=100,000 COLONIES/ML     Performed at [facility]   Culture     Final   Value: GRAM NEGATIVE RODS     Performed at [facility]   Report Status PENDING   Incomplete     Labs: Basic Metabolic Panel:  Recent Labs Lab 02/03/14 1517 02/04/14 0500  NA 140 143  K 4.0 4.0  CL 103 106  CO2 22 25  GLUCOSE 110* 92  BUN 7 6  CREATININE 0.73 0.73  CALCIUM 9.2 9.1   Liver Function Tests:  Recent Labs Lab 02/04/14 0500  AST 139*  ALT 79*  ALKPHOS 70  BILITOT 0.4  PROT 6.8  ALBUMIN 3.6   No results found for this basename: LIPASE, AMYLASE,  in the last 168 hours No results found for this basename: AMMONIA,  in the last 168 hours CBC:  Recent Labs Lab 02/03/14 1517 02/04/14 0500  WBC 4.2 4.6  NEUTROABS 1.6*  --   HGB 11.2* 10.5*  HCT 33.5* 32.1*  MCV 93.1 93.0  PLT 308 271   Cardiac Enzymes: No results found for this basename: CKTOTAL, CKMB, CKMBINDEX, TROPONINI,  in the last 168 hours BNP: BNP (last 3 results) No results found for this  basename: PROBNP,  in the last 8760 hours CBG:  Recent Labs Lab 02/04/14 0731 02/05/14 0813  GLUCAP 138* 103*    Time coordinating discharge: Over 30 minutes
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
## 2014-02-05 NOTE — Discharge Instructions (Signed)
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
## 2014-02-06 LAB — URINE CULTURE
Colony Count: >=100000
Special Requests: NORMAL

## 2014-02-06 LAB — FACTOR 2 ASSAY: Factor II Activity: 91 % (ref 74–131)

## 2014-02-06 NOTE — Progress Notes (Signed)
02/05/2014 0938 NCM contacted pt and left voice message for a return call. Will follow up with pt on [facility] contact number and to see if she was able to obtain medications. Isidoro Donning RN CCM Case Mgmt phone [phone]

## 2014-02-07 LAB — LUPUS ANTICOAGULANT PANEL
DRVVT: 30.5 s (ref <42.9)
LUPUS ANTICOAGULANT: NOT DETECTED
PTT Lupus Anticoagulant: 33.4 secs (ref 28.0–43.0)

## 2014-02-09 LAB — FACTOR 5 LEIDEN

## 2014-02-09 LAB — PROTEIN C, TOTAL: PROTEIN C, TOTAL: 72 % (ref 72–160)

## 2014-02-09 LAB — PROTEIN S, TOTAL: Protein S Ag, Total: 77 % (ref 60–150)

## 2014-03-28 ENCOUNTER — Emergency Department (HOSPITAL_COMMUNITY)
Admission: EM | Admit: 2014-03-28 | Discharge: 2014-03-28 | Discharge status: Against Medical Advice | Payer: Medicare Other | Attending: Emergency Medicine | Admitting: Emergency Medicine

## 2014-03-28 ENCOUNTER — Encounter (HOSPITAL_COMMUNITY): Payer: Self-pay | Admitting: Emergency Medicine

## 2014-03-28 DIAGNOSIS — R110 Nausea: Secondary | ICD-10-CM | POA: Insufficient documentation

## 2014-03-28 DIAGNOSIS — R079 Chest pain, unspecified: Secondary | ICD-10-CM | POA: Diagnosis present

## 2014-03-28 DIAGNOSIS — M79604 Pain in right leg: Secondary | ICD-10-CM | POA: Insufficient documentation

## 2014-03-28 DIAGNOSIS — R0602 Shortness of breath: Secondary | ICD-10-CM | POA: Diagnosis not present

## 2014-03-28 DIAGNOSIS — R0789 Other chest pain: Secondary | ICD-10-CM | POA: Insufficient documentation

## 2014-03-28 DIAGNOSIS — Z87891 Personal history of nicotine dependence: Secondary | ICD-10-CM | POA: Insufficient documentation

## 2014-03-28 NOTE — ED Notes (Signed)
Pt requesting ultrasound of leg "right now." Pt made aware that this service cannot be provided at this time. Pt states that she is going to [facility]. PT in NAD.

## 2014-03-28 NOTE — ED Notes (Signed)
Pt reports central chest tightness that started this evening with SOB and nausea. States it lasted appx 30 sec and then resolved. Pt also reports 8/10 right leg pain behind knee. Reports hx of DVTs, states this feels similar. Denies injury. Pt currently taking Lovenox shots. Pt in NAD.

## 2014-07-19 ENCOUNTER — Emergency Department (HOSPITAL_COMMUNITY)
Admission: EM | Admit: 2014-07-19 | Discharge: 2014-07-19 | Disposition: A | Discharge status: Home / Self Care | Payer: Medicare Other | Attending: Emergency Medicine | Admitting: Emergency Medicine

## 2014-07-19 ENCOUNTER — Emergency Department (HOSPITAL_COMMUNITY): Payer: Medicare Other

## 2014-07-19 ENCOUNTER — Encounter (HOSPITAL_COMMUNITY): Payer: Self-pay | Admitting: Emergency Medicine

## 2014-07-19 DIAGNOSIS — I82403 Acute embolism and thrombosis of unspecified deep veins of lower extremity, bilateral: Secondary | ICD-10-CM

## 2014-07-19 DIAGNOSIS — R0789 Other chest pain: Secondary | ICD-10-CM | POA: Insufficient documentation

## 2014-07-19 DIAGNOSIS — Z79899 Other long term (current) drug therapy: Secondary | ICD-10-CM | POA: Diagnosis not present

## 2014-07-19 DIAGNOSIS — M79661 Pain in right lower leg: Secondary | ICD-10-CM | POA: Diagnosis present

## 2014-07-19 DIAGNOSIS — Z87891 Personal history of nicotine dependence: Secondary | ICD-10-CM | POA: Diagnosis not present

## 2014-07-19 DIAGNOSIS — R079 Chest pain, unspecified: Secondary | ICD-10-CM

## 2014-07-19 DIAGNOSIS — I82493 Acute embolism and thrombosis of other specified deep vein of lower extremity, bilateral: Secondary | ICD-10-CM | POA: Diagnosis not present

## 2014-07-19 DIAGNOSIS — M79604 Pain in right leg: Secondary | ICD-10-CM

## 2014-07-19 DIAGNOSIS — R000 Tachycardia, unspecified: Secondary | ICD-10-CM | POA: Diagnosis not present

## 2014-07-19 DIAGNOSIS — R071 Chest pain on breathing: Secondary | ICD-10-CM

## 2014-07-19 DIAGNOSIS — Z9114 Patient's other noncompliance with medication regimen: Secondary | ICD-10-CM

## 2014-07-19 DIAGNOSIS — R0602 Shortness of breath: Secondary | ICD-10-CM | POA: Diagnosis not present

## 2014-07-19 LAB — BASIC METABOLIC PANEL
Anion gap: 9 (ref 5–15)
BUN: 15 mg/dL (ref 6–23)
CO2: 23 mmol/L (ref 19–32)
Calcium: 9.9 mg/dL (ref 8.4–10.5)
Chloride: 105 mmol/L (ref 96–112)
Creatinine, Ser: 1.08 mg/dL (ref 0.50–1.10)
GFR calc Af Amer: 74 mL/min — ABNORMAL LOW (ref >90)
GFR calc non Af Amer: 64 mL/min — ABNORMAL LOW (ref >90)
Glucose, Bld: 103 mg/dL — ABNORMAL HIGH (ref 70–99)
Potassium: 4.8 mmol/L (ref 3.5–5.1)
Sodium: 137 mmol/L (ref 135–145)

## 2014-07-19 LAB — CBC WITH DIFFERENTIAL/PLATELET
Basophils Absolute: 0 10*3/uL (ref 0.0–0.1)
Basophils Relative: 0 % (ref 0–1)
Eosinophils Absolute: 0 10*3/uL (ref 0.0–0.7)
Eosinophils Relative: 0 % (ref 0–5)
HCT: 38.2 % (ref 36.0–46.0)
Hemoglobin: 12.6 g/dL (ref 12.0–15.0)
Lymphocytes Relative: 24 % (ref 12–46)
Lymphs Abs: 1.7 10*3/uL (ref 0.7–4.0)
MCH: 31.8 pg (ref 26.0–34.0)
MCHC: 33 g/dL (ref 30.0–36.0)
MCV: 96.5 fL (ref 78.0–100.0)
Monocytes Absolute: 0.6 10*3/uL (ref 0.1–1.0)
Monocytes Relative: 8 % (ref 3–12)
Neutro Abs: 4.8 10*3/uL (ref 1.7–7.7)
Neutrophils Relative %: 68 % (ref 43–77)
Platelets: 266 10*3/uL (ref 150–400)
RBC: 3.96 MIL/uL (ref 3.87–5.11)
RDW: 13.1 % (ref 11.5–15.5)
WBC: 7.2 10*3/uL (ref 4.0–10.5)

## 2014-07-19 MED ORDER — MORPHINE SULFATE 4 MG/ML IJ SOLN: 4.0000 mg | Freq: Once | INTRAMUSCULAR | Status: DC

## 2014-07-19 MED ORDER — ENOXAPARIN SODIUM 60 MG/0.6ML ~~LOC~~ SOLN: 60.0000 mg | Freq: Two times a day (BID) | SUBCUTANEOUS | Status: DC

## 2014-07-19 MED ORDER — IOHEXOL 350 MG/ML SOLN
100.0000 mL | Freq: Once | INTRAVENOUS | Status: AC | PRN
  Administered 2014-07-19: 100 mL via INTRAVENOUS

## 2014-07-19 MED ORDER — HYDROMORPHONE HCL 1 MG/ML IJ SOLN
1.0000 mg | Freq: Once | INTRAMUSCULAR | Status: AC
  Administered 2014-07-19: 1 mg via INTRAVENOUS
  Filled 2014-07-19: qty 1

## 2014-07-19 MED ORDER — OXYCODONE-ACETAMINOPHEN 5-325 MG PO TABS: 1.0000 | ORAL_TABLET | Freq: Four times a day (QID) | ORAL | Status: DC | PRN

## 2014-07-19 MED ORDER — ENOXAPARIN SODIUM 60 MG/0.6ML ~~LOC~~ SOLN
60.0000 mg | SUBCUTANEOUS | Status: AC
  Administered 2014-07-19: 60 mg via SUBCUTANEOUS
  Filled 2014-07-19: qty 0.6

## 2014-07-19 MED ORDER — OXYCODONE-ACETAMINOPHEN 5-325 MG PO TABS
2.0000 | ORAL_TABLET | Freq: Once | ORAL | Status: DC
  Filled 2014-07-19: qty 2

## 2014-07-19 NOTE — ED Notes (Signed)
Pt has been off her blood thinners for 4 months.

## 2014-07-19 NOTE — ED Notes (Signed)
Bed: WA08 Expected date:  Expected time:  Means of arrival:  Comments: TR7 poss PE
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
## 2014-07-19 NOTE — Progress Notes (Signed)
EDCM asked to see patient regarding lovenox assistance.  Patient confirms she has Medicare and Medicaid of [place] insurance, also e verifyed in [place].  Patient confirms she does not have a pcp.  Patient reports she is staying at her fiance's in [place].  EDCM will provide patient with list of pcps who accept Medicare insurance within a ten mile radius of [phone].  Patient thankful for resources.  No further EDCM needs at this time.

## 2014-07-19 NOTE — ED Notes (Signed)
Pt talking to social worker.
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
## 2014-07-19 NOTE — Progress Notes (Signed)
VASCULAR LAB PRELIMINARY  PRELIMINARY  PRELIMINARY  PRELIMINARY  Bilateral lower extremity venous duplex completed.    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 Also noted is chronic thrombus of the saphenofemoral junction.   Left - Acute deep vein thrombus of the femoral and common femoral veins. Also noted is a chronic thrombus in the saphenofemoral junction.  Calinda Stockinger, RVS 07/19/2014, 8:20 PM

## 2014-07-19 NOTE — Progress Notes (Signed)
Pt states that she has a car. Pt does not need taxi voucher.  Trish MageBrittney Jese Comella, LCSWA [phone] ED CSW 07/19/2014 11:02 PM

## 2014-07-19 NOTE — ED Notes (Signed)
MD at bedside. 

## 2014-07-19 NOTE — ED Notes (Signed)
Pt states that she has been having rt lower leg pain with swelling.  Pt states that she has hx of blood clots.  Also c/o SOB that started today.

## 2014-08-27 ENCOUNTER — Emergency Department (HOSPITAL_COMMUNITY): Payer: Medicare Other

## 2014-08-27 ENCOUNTER — Emergency Department (HOSPITAL_COMMUNITY)
Admission: EM | Admit: 2014-08-27 | Discharge: 2014-08-27 | Disposition: A | Discharge status: Home / Self Care | Payer: Medicare Other | Attending: Emergency Medicine | Admitting: Emergency Medicine

## 2014-08-27 ENCOUNTER — Encounter (HOSPITAL_COMMUNITY): Payer: Self-pay

## 2014-08-27 DIAGNOSIS — R112 Nausea with vomiting, unspecified: Secondary | ICD-10-CM | POA: Diagnosis not present

## 2014-08-27 DIAGNOSIS — R42 Dizziness and giddiness: Secondary | ICD-10-CM | POA: Insufficient documentation

## 2014-08-27 DIAGNOSIS — M79605 Pain in left leg: Secondary | ICD-10-CM | POA: Diagnosis present

## 2014-08-27 DIAGNOSIS — Z7901 Long term (current) use of anticoagulants: Secondary | ICD-10-CM | POA: Diagnosis not present

## 2014-08-27 DIAGNOSIS — I824Z2 Acute embolism and thrombosis of unspecified deep veins of left distal lower extremity: Secondary | ICD-10-CM | POA: Diagnosis not present

## 2014-08-27 DIAGNOSIS — R0602 Shortness of breath: Secondary | ICD-10-CM | POA: Diagnosis not present

## 2014-08-27 DIAGNOSIS — Z87891 Personal history of nicotine dependence: Secondary | ICD-10-CM | POA: Diagnosis not present

## 2014-08-27 DIAGNOSIS — R079 Chest pain, unspecified: Secondary | ICD-10-CM | POA: Diagnosis not present

## 2014-08-27 DIAGNOSIS — Z79899 Other long term (current) drug therapy: Secondary | ICD-10-CM | POA: Diagnosis not present

## 2014-08-27 DIAGNOSIS — I82402 Acute embolism and thrombosis of unspecified deep veins of left lower extremity: Secondary | ICD-10-CM

## 2014-08-27 DIAGNOSIS — I82409 Acute embolism and thrombosis of unspecified deep veins of unspecified lower extremity: Secondary | ICD-10-CM

## 2014-08-27 LAB — BASIC METABOLIC PANEL
ANION GAP: 9 (ref 5–15)
BUN: 6 mg/dL (ref 6–23)
CALCIUM: 9.4 mg/dL (ref 8.4–10.5)
CO2: 24 mmol/L (ref 19–32)
CREATININE: 0.77 mg/dL (ref 0.50–1.10)
Chloride: 99 mmol/L (ref 96–112)
GFR calc non Af Amer: >90 mL/min (ref >90)
Glucose, Bld: 110 mg/dL — ABNORMAL HIGH (ref 70–99)
Potassium: 3.7 mmol/L (ref 3.5–5.1)
Sodium: 132 mmol/L — ABNORMAL LOW (ref 135–145)

## 2014-08-27 LAB — I-STAT BETA HCG BLOOD, ED (MC, WL, AP ONLY): I-stat hCG, quantitative: <5 m[IU]/mL (ref <5)

## 2014-08-27 LAB — CBC
HEMATOCRIT: 36.7 % (ref 36.0–46.0)
Hemoglobin: 12.3 g/dL (ref 12.0–15.0)
MCH: 31.5 pg (ref 26.0–34.0)
MCHC: 33.5 g/dL (ref 30.0–36.0)
MCV: 94.1 fL (ref 78.0–100.0)
PLATELETS: 250 10*3/uL (ref 150–400)
RBC: 3.9 MIL/uL (ref 3.87–5.11)
RDW: 12.5 % (ref 11.5–15.5)
WBC: 4.6 10*3/uL (ref 4.0–10.5)

## 2014-08-27 LAB — BRAIN NATRIURETIC PEPTIDE: B Natriuretic Peptide: 21.3 pg/mL (ref 0.0–100.0)

## 2014-08-27 LAB — I-STAT TROPONIN, ED: Troponin i, poc: 0 ng/mL (ref 0.00–0.08)

## 2014-08-27 MED ORDER — OXYCODONE-ACETAMINOPHEN 5-325 MG PO TABS: 1.0000 | ORAL_TABLET | Freq: Four times a day (QID) | ORAL | Status: DC | PRN

## 2014-08-27 MED ORDER — OXYCODONE-ACETAMINOPHEN 5-325 MG PO TABS
1.0000 | ORAL_TABLET | Freq: Four times a day (QID) | ORAL | Status: DC | PRN
Start: 2014-08-27 — End: 2014-08-27

## 2014-08-27 MED ORDER — SODIUM CHLORIDE 0.9 % IV SOLN: INTRAVENOUS | Status: DC

## 2014-08-27 MED ORDER — CLONAZEPAM 0.5 MG PO TABS
0.5000 mg | ORAL_TABLET | Freq: Every day | ORAL | Status: DC
  Administered 2014-08-27 (×2): 0.5 mg via ORAL
  Filled 2014-08-27 (×2): qty 1

## 2014-08-27 MED ORDER — IOHEXOL 350 MG/ML SOLN
100.0000 mL | Freq: Once | INTRAVENOUS | Status: AC | PRN
  Administered 2014-08-27: 100 mL via INTRAVENOUS

## 2014-08-27 NOTE — ED Provider Notes (Signed)
CSN: [id number]     Arrival date & time 08/27/14  1918 History   First MD Initiated Contact with Patient 08/27/14 2036     Chief Complaint  Patient presents with  . Chest Pain  . Leg Pain  . Shortness of Breath     (Consider location/radiation/quality/duration/timing/severity/associated sxs/prior Treatment) HPI    PCP: No primary care provider on file. no PCP, moved here from [place], [place] 1[place]5 months ago. Blood pressure 116/74, pulse 83, temperature 99.1 F (37.3 C), temperature source Oral, resp. rate 16, height 5\' 5"  (1.651 m), last menstrual period 07/29/2014, SpO2 100 %.  Jaclyn Dougherty is a 39 y.o.female with a significant PMH of DVT- left  presents to the ER with complaints of acutely worsening Left leg pain at 5 pm over the past few days. She has known DVT she reports for which she will be on Lovenox for the rest of her life due to genetic disposition for blood clots, due to see local oncologist soon but does not remember his name. She developed chest pain (left/center/upper pain) that worsens with a large breath, SOB and nausea with vomiting.  She has associated lightheadedness since the SOB. US done 1 month ago of LE left and it showed that her DVT had worsened from prior.  Negative Review of Symptoms: no URI symptoms of fever, cough, sore throat, ear pain, abdominal pain, back pain, dysuria, diarrhea, weakness, fatigue, syncope   Past Medical History  Diagnosis Date  . DVT (deep venous thrombosis) 01/2014   Past Surgical History  Procedure Laterality Date  . Tonsillectomy    . Breast reconstruction Bilateral 2000    breast augumentation  . Dilation and curettage of uterus  July, 2015     miscarriage @ 10 weeks    History reviewed. No pertinent family history. History  Substance Use Topics  . Smoking status: Former Smoker    Types: Cigarettes    Quit date: 01/26/2014  . Smokeless tobacco: Not on file     Comment: smokes electronic cigarettes  . Alcohol Use: Yes      Comment: social    OB History    No data available     Review of Systems  10 Systems reviewed and are negative for acute change except as noted in the HPI.   Allergies  Review of patient's allergies indicates no known allergies.  Home Medications   Prior to Admission medications   Medication Sig Start Date End Date Taking? Authorizing Provider  clonazePAM (KLONOPIN) 1 MG tablet Take 1 tablet (1 mg total) by mouth 3 (three) times daily. Patient taking differently: Take 1 mg by mouth 2 (two) times daily.  02/05/14  Yes Alison MurrayAlma M Devine, MD  enoxaparin (LOVENOX) 60 MG/0.6ML injection Inject 0.6 mLs (60 mg total) into the skin every 12 (twelve) hours. 07/19/14  Yes Junius FinnerErin O'Malley, PA-C  prazosin (MINIPRESS) 2 MG capsule Take 10 mg by mouth at bedtime.    Yes Historical Provider, MD  traZODone (DESYREL) 50 MG tablet Take 100 mg by mouth at bedtime.    Yes Historical Provider, MD  ciprofloxacin (CIPRO) 500 MG tablet Take 1 tablet (500 mg total) by mouth 2 (two) times daily. Patient not taking: Reported on 07/19/2014 02/05/14   Alison MurrayAlma M Devine, MD  oxyCODONE-acetaminophen (PERCOCET/ROXICET) 5-325 MG per tablet Take 1-2 tablets by mouth every 6 (six) hours as needed for moderate pain or severe pain. 08/27/14   Genna Casimir Neva SeatGreene, PA-C   BP 114/65 mmHg  Pulse 83  Temp(Src)  99.1 F (37.3 C) (Oral)  Resp 16  Ht  (1.651 m)  SpO2 99%  LMP 07/29/2014 Physical Exam  Constitutional: She appears well-developed and well-nourished. No distress.  HENT:  Head: Normocephalic and atraumatic.  Eyes: Pupils are equal, round, and reactive to light.  Neck: Normal range of motion. Neck supple.  Cardiovascular: Normal rate and regular rhythm.   Pulmonary/Chest: Effort normal and breath sounds normal. She has no decreased breath sounds. She has no wheezes. She has no rhonchi. Chest wall is not dull to percussion. She exhibits no tenderness.  Abdominal: Soft.  Musculoskeletal:       Left knee: She exhibits  swelling. Tenderness (posterior) found.  Neurological: She is alert.  Skin: Skin is warm and dry.  Nursing note and vitals reviewed.   ED Course  Procedures (including critical care time) Labs Review Labs Reviewed  BASIC METABOLIC PANEL - Abnormal; Notable for the following:    Sodium 132 (*)    Glucose, Bld 110 (*)    All other components within normal limits  CBC  BRAIN NATRIURETIC PEPTIDE  I-STAT TROPOININ, ED  I-STAT BETA HCG BLOOD, ED ([facility], [facility], [facility] ONLY)    Imaging Review Dg Chest 2 View  08/27/2014   CLINICAL DATA:  Sharp left-sided chest pain, acute onset. Initial encounter.  EXAM: CHEST  2 VIEW  COMPARISON:  Chest radiograph and CTA of the chest performed 07/19/2014  FINDINGS: The lungs are well-aerated and clear. There is no evidence of focal opacification, pleural effusion or pneumothorax.  The heart is normal in size; the mediastinal contour is within normal limits. No acute osseous abnormalities are seen.  IMPRESSION: No acute cardiopulmonary process seen. No displaced rib fractures identified.   Electronically Signed   By: Roanna Raider M.D.   On: 08/27/2014 20:16   Ct Angio Chest Pe W/cm &/or Wo Cm  08/27/2014   CLINICAL DATA:  Known deep venous thrombosis, with left leg swelling. Acute onset of sharp left-sided chest pain. Assess for pulmonary embolus. Initial encounter.  EXAM: CT ANGIOGRAPHY CHEST WITH CONTRAST  TECHNIQUE: Multidetector CT imaging of the chest was performed using the standard protocol during bolus administration of intravenous contrast. Multiplanar CT image reconstructions and MIPs were obtained to evaluate the vascular anatomy.  CONTRAST:  OMNIPAQUE IOHEXOL 350 MG/ML SOLN  COMPARISON:  CTA of the chest performed 07/19/2014, and chest radiograph performed earlier today at 8:09 p.m.  FINDINGS: There is no evidence of pulmonary embolus.  The lungs appear clear bilaterally. There is no evidence of significant focal consolidation, pleural effusion or  pneumothorax. No masses are identified; no abnormal focal contrast enhancement is seen.  The mediastinum is unremarkable in appearance. No mediastinal lymphadenopathy is seen. No pericardial effusions identified. The great vessels are grossly unremarkable in appearance. No axillary lymphadenopathy is seen. The visualized portions of the thyroid gland are unremarkable in appearance.  The patient's bilateral breast implants appear grossly intact, and are unremarkable in appearance.  The visualized portions of the liver and spleen are unremarkable.  No acute osseous abnormalities are seen.  Review of the MIP images confirms the above findings.  IMPRESSION: 1. No evidence of pulmonary embolus. 2. Lungs clear bilaterally.   Electronically Signed   By: Roanna Raider M.D.   On: 08/27/2014 23:13     EKG Interpretation   Date/Time:  Sunday August 27 2014 19:33:20 EDT Ventricular Rate:  77 PR Interval:  138 QRS Duration: 88 QT Interval:  372 QTC Calculation: 420 R Axis:  78 Text Interpretation:  Normal sinus rhythm Normal ECG since last tracing no  significant change Confirmed by MILLER  MD, BRIAN [phone]) on 08/27/2014  8:51:13 PM      MDM   Final diagnoses:  DVT (deep venous thrombosis)  SOB (shortness of breath)    The patient has had no tachycardia, no hypoxia, normal blood pressure and normal pulse. She's not been coughing. She does complain of anxiety and request her Klonopin 1 mg. Due to the patient's history of DVT in her port of worsening of DVT despite Lovenox therapy last month CT angio was done. A VQ scan was considered but the staff acquired was not available at the time. The patient CT angios negative for PE or any other abnormality. Had a long discussion with the patient about the risks of repeated radiation. She was scheduled to see a hematologist oncologist before moving to [facility] and does not have primary care for follow-up for her DVT with hematologist or oncologist. I see her  previous admissions in the past couple months she has been directed to call for primary care doctor. The patient denies making the phone call like she has been recommended.   Vital signs stable, patient is no acute distress. Help manage her leg pain and refer patient to heme/onc.  39 y.o.Jason Dougherty's evaluation in the Emergency Department is complete. It has been determined that no acute conditions requiring further emergency intervention are present at this time. The patient/guardian have been advised of the diagnosis and plan. We have discussed signs and symptoms that warrant return to the ED, such as changes or worsening in symptoms.  Vital signs are stable at discharge. Filed Vitals:   08/27/14 2315  BP: 114/65  Pulse: 83  Temp:   Resp:     Patient/guardian has voiced understanding and agreed to follow-up with the PCP or specialist.    Marlon Pel, PA-C 08/27/14 2330  Eber Hong, MD 08/28/14 [phone]

## 2014-08-27 NOTE — ED Notes (Signed)
Onset few hours sharp pain in posterior left leg behind knee.  Pt having shortness of breath, mid to left chest pain, lightheadedness, nausea and vomiting x 1.  Pt has had nausea x 5 days.  Pt is on Lovenox with h/o DVT.  Pt talking in complete sentences no obvious respiratory distress noted.

## 2014-08-27 NOTE — Discharge Instructions (Signed)

## 2014-09-29 NOTE — Consult Note (Signed)
History of Present Illness:  Reason for Consult DVT.   HPI   Patient is a 39 year old female with no significant past medical history who developed pain and swelling in her leg 3-4 days ago.  When her symptoms did not resolve, she presented to the emergency room and was found to have an extensive DVT.  She has no personal history of DVT.  Patient states her mother had a DVT in her 62s.  She otherwise feels well.  She has no neurologic complaints.  She denies any recent fevers.  She has no chest pain or shortness of breath.  She has a good appetite and denies weight loss.  She has no nausea, vomiting, constipation, or diarrhea.  She has no urinary complaints.  Patient otherwise feels well and offers no further specific complaints.  PFSH:  Additional Past Medical and Surgical History Depression, anxiety, PTSD.  Family history: Mother with DVT otherwise noncontributory.  Social history: Patient denies tobacco or alcohol.   Review of Systems:  Performance Status (ECOG) 0   Review of Systems   As per HPI. Otherwise, 10 point system review was negative.   NURSING NOTES: **Vital Signs.:   17-May-14 13:43   Vital Signs Type: Routine   Temperature Temperature (F): 98.4   Celsius: 36.8   Temperature Source: oral   Pulse Pulse: 93   Respirations Respirations: 20   Systolic BP Systolic BP: 093   Diastolic BP (mmHg) Diastolic BP (mmHg): 74   Mean BP: 86   Pulse Ox % Pulse Ox %: 99   Pulse Ox Activity Level: At rest   Oxygen Delivery: Room Air/ 21 %   Physical Exam:  Physical Exam General: Well-developed, well-nourished, no acute distress. Eyes: Pink conjunctiva, anicteric sclera. HEENT: Normocephalic, moist mucous membranes, clear oropharnyx. Lungs: Clear to auscultation bilaterally. Heart: Regular rate and rhythm. No rubs, murmurs, or gallops. Abdomen: Soft, nontender, nondistended. No organomegaly noted, normoactive bowel sounds. Musculoskeletal: No edema, cyanosis, or  clubbing. Neuro: Alert, answering all questions appropriately. Cranial nerves grossly intact. Skin: No rashes or petechiae noted. Psych: Normal affect.    No Known Allergies:     Saphris 10 mg sublingual tablet: 10 milligram(s) sublingual once a day (at bedtime), Status: Active, Quantity: 0, Refills: None   prazosin 2 mg oral capsule: 2 tab(s) orally once a day (at bedtime), Status: Active, Quantity: 0, Refills: None   Klonopin 1 mg oral tablet: 1 tab(s) orally 2 times a day, Status: Active, Quantity: 0, Refills: None   Risperdal 1 mg oral tablet: 1 tab(s) orally once a day, Status: Active, Quantity: 0, Refills: None  Laboratory Results: Hepatic:  17-May-14 06:01   Bilirubin, Total 0.5  Alkaline Phosphatase 113  SGPT (ALT) 16  SGOT (AST)  55  Total Protein, Serum  8.6  Albumin, Serum 4.1  Routine Chem:  17-May-14 06:01   Glucose, Serum 99  BUN 10  Creatinine (comp) 0.95  Sodium, Serum  135  Potassium, Serum 3.6  Chloride, Serum 98  CO2, Serum 24  Calcium (Total), Serum 9.5  Osmolality (calc) 269  eGFR (African American) >60  eGFR (Non-African American) >60 (eGFR values <36m/min/1.73 m2 may be an indication of chronic kidney disease (CKD). Calculated eGFR is useful in patients with stable renal function. The eGFR calculation will not be reliable in acutely ill patients when serum creatinine is changing rapidly. It is not useful in  patients on dialysis. The eGFR calculation may not be applicable to patients at the low and high extremes of  body sizes, pregnant women, and vegetarians.)  Result Comment POTASSIUM/AST - Slight hemolysis, interpret results with  - caution.  Result(s) reported on 23 Oct 2012 at 06:52AM.  Anion Gap 13  Routine Coag:  17-May-14 06:01   Prothrombin 14.4  INR 1.1 (INR reference interval applies to patients on anticoagulant therapy. A single INR therapeutic range for coumarins is not optimal for all indications; however, the suggested range  for most indications is 2.0 - 3.0. Exceptions to the INR Reference Range may include: Prosthetic heart valves, acute myocardial infarction, prevention of myocardial infarction, and combinations of aspirin and anticoagulant. The need for a higher or lower target INR must be assessed individually. Reference: The Pharmacology and Management of the Vitamin K  antagonists: the seventh ACCP Conference on Antithrombotic and Thrombolytic Therapy. PNTIR.4431 Sept:126 (3suppl): [id number]. A HCT value >55% may artifactually increase the PT.  In one study,  the increase was an average of 25%. Reference:  "Effect on Routine and Special Coagulation Testing Values of Citrate Anticoagulant Adjustment in Patients with High HCT Values." American Journal of Clinical Pathology 2006;126:400-405.)  Activated PTT (APTT) 33.7 (A HCT value >55% may artifactually increase the APTT. In one study, the increase was an average of 19%. Reference: "Effect on Routine and Special Coagulation Testing Values of Citrate Anticoagulant Adjustment in Patients with High HCT Values." American Journal of Clinical Pathology 2006;126:400-405.)  Routine Hem:  17-May-14 06:01   WBC (CBC) 8.9  RBC (CBC)  3.69  Hemoglobin (CBC) 12.5  Hematocrit (CBC) 36.8  Platelet Count (CBC) 216 (Result(s) reported on 23 Oct 2012 at 06:23AM.)  MCV 100  MCH 34.0  MCHC 34.1  RDW 12.7   Assessment and Plan: Impression:   DVT Plan:   1.  DVT: Patient does not appear to have any transient risk factors.  A full hypercoagulable workup has been initiated, but these results should be interpreted with caution given her acute DVT as well as labs were drawn while patient was on blood thinners.  She will require a minimum of 3 months of treatment.  If her hypercoagulable workup is positive, she may require lifelong treatment.  Patient can be discharged from the hospital once her anticoagulation treatment is established since she has no income or insurance.   Patient will require a primary care physician upon discharge.  She can followup in the [place] in 4-6 weeks to discuss the results of her workup. consult, call with questions.  Electronic Signatures: Delight Hoh (MD)  (Signed 17-May-14 17:24)  Authored: HISTORY OF PRESENT ILLNESS, PFSH, ROS, NURSING NOTES, PE, ALLERGIES, HOME MEDICATIONS, LABS, ASSESSMENT AND PLAN   Last Updated: 17-May-14 17:24 by Delight Hoh (MD)

## 2014-09-29 NOTE — Discharge Summary (Signed)
PATIENT NAME:  Jaclyn Dougherty, Jaclyn Dougherty MR#:  [id number] DATE OF BIRTH:  09-12-1975  DATE OF ADMISSION:  10/23/2012 DATE OF DISCHARGE:  10/26/2012  DISCHARGE DIAGNOSES: 1.  Right lower extremity femoral popliteal vein deep vein thrombosis.  2.  Tobacco abuse.  3.  Bipolar disorder.   IMAGING STUDIES: Include venous ultrasound Doppler of right lower extremity showed extensive thrombus throughout the common femoral, superficial femoral, and popliteal veins on the right lower extremity.   ADMITTING HISTORY AND PHYSICAL AND HOSPITAL COURSE: Please see detailed H and P dictated by Dr. Elisabeth PigeonVachhani on 10/23/2012. In brief, this is a 39 year old female patient with past history of bipolar disorder who presented to the Emergency Room complaining of significant swelling of her right lower extremity. The patient had venous Doppler done which showed extensive DVT and was admitted to the hospitalist service for further work-up and treatment. The patient did not have any shortness of breath, chest pain, or palpitations. Was started on Lovenox and Coumadin. Later was transitioned to Xarelto, but on realizing that the patient could not get this covered by her insurance was switched back to Lovenox and Coumadin the day prior to discharge. Today the patient's INR is 1.2 and is being discharged home for further treatment of her DVT for 3 more months with bridging of Lovenox for 5 more days along with Coumadin. The patient has been set up for a PT, INR check at [facility] on 10/28/2012.   The patient, on exam, has edema of the right lower extremity along with swelling, mildly warm but no erythema.  No fever.   DISCHARGE MEDICATIONS: 1.  Coumadin 5 mg oral once a day.  2.  Lovenox 60 mg oral 2 times a day for 5 days.  3.  Klonopin 1 mg oral 2 times a day.  4.  Risperdal 1 mg oral once a day.  5.  Saphris 10 mg sublingual once a day at bedtime.  6.  Prazosin 2 mg 2 tablets oral once a day at bedtime.  7.   Acetaminophen/hydrocodone 325/5 mg 1 tablet oral every 4 hours as needed for pain.   DISCHARGE INSTRUCTIONS: The patient is being discharged home on a regular diet of regular consistency. Activity as tolerated. She has been given a work note to be off from work until 10/28/2012. Appointment given at [facility] for 10/28/2012 for an INR check. The patient has been given instructions regarding Coumadin dosing and for dietary modifications.   TIME SPENT: On the day of discharge in discharge activity was 40 minutes. ____________________________ Molinda BailiffSrikar R. Dempsey Ahonen, MD srs:sb D: 10/26/2012 13:56:04 ET T: 10/26/2012 14:25:59 ET JOB#: [id number]  cc: Wardell HeathSrikar R. Cashton Hosley, MD, <Dictator> [facility] Orie FishermanSRIKAR R Jereline Ticer MD ELECTRONICALLY SIGNED 11/08/2012 23:29

## 2014-09-29 NOTE — H&P (Signed)
PATIENT NAME:  Jaclyn, Dougherty MR#:  [id number] DATE OF BIRTH:  12/31/1975  DATE OF ADMISSION:  10/23/2012  PRIMARY CARE PHYSICIAN: None.   REFERRING ER PHYSICIAN:  Brandt Loosen, MD  CHIEF COMPLAINT: Swelling of the right lower limb.   HISTORY OF PRESENT ILLNESS: The patient is a 39 year old female with a past medical history of mania and anxiety disorder, who came to the Emergency Room because she noticed her right leg is swollen and painful for the last 3 to 4 days.  The patient states that for the last few weeks she has not been moving enough, staying in the bed all day due to depression and last Wednesday, which was 3 days ago, she did excessive work-up on the treadmill and since then she noticed her right leg is getting swollen compared to the left and the swelling is increasing and is getting painful for her and so she decided to come to the Emergency Room last night. On further questioning, she denies any other episodes of similar leg swelling in the past. Denies any fever or travel history, denies any travel for long hours. Denies any recent hospitalization.  The patient also had a history of a few miscarriages in the past.   PAST MEDICAL HISTORY:  Mania and bipolar disorder, anxiety disorder and stress disorder.  FAMILY HISTORY: Her mother had clots at the age of 35s, then in her 5s.   SOCIAL HISTORY: She was a smoker and switched to electronic cigarettes 6 months ago. Alcohol:  Drinks occasionally socially, denies any drug use. Has 5 living children and other few in pregnancy which ended up in miscarriage. No further work-up was done in the past to diagnose the reason for miscarriages.   MEDICATIONS AT HOME:  Saphris 10 mg sublingual once a day, Risperdal 1 mg oral tablet once a day, prazosin 2 mg oral capsule 2 tablets once a day at bedtime, Klonopin 1 mg oral tablet 2 times a day.  She was also taking some birth control pills in the past infrequently but currently not taking  it.  REVIEW OF SYSTEMS: Negative for fever, fatigue, weakness. Pain in the right leg.  EYES: No blurring or double vision, pain or redness.  EARS, NOSE, THROAT: No tinnitus, ear pain, hearing loss.  RESPIRATORY: No cough, wheezing, hemoptysis or dyspnea.  CARDIOVASCULAR: No chest pain, orthopnea, edema or palpitations.  GASTROINTESTINAL: Nausea, vomiting, diarrhea or abdominal pain all negative.  GENITOURINARY: No increased frequency of the urination or burning in the urine.  ENDOCRINE: No heat or cold intolerance.  SKIN: No acne or rashes on the skin.  MUSCULOSKELETAL: No pain in the joint but left lower limb is painful.  NEUROLOGICAL: No numbness or weakness, tremors or vertigo. PSYCHIATRIC:  Has anxiety and bipolar disorder.   PHYSICAL EXAMINATION: VITAL SIGNS: Temperature 98.6, blood pressure 131/83, pulse rate 113, respirations 20, pulse oximetry 98 on room air.  GENERAL: Fully alert and oriented to time, place and person, slightly anxious, but cooperative with history taking and physical examination.  HEENT: Atraumatic. Conjunctivae pink. Oral mucosa moist.  NECK: Supple. No JVD.  RESPIRATORY: Bilateral clear and equal air entry.  CARDIOVASCULAR: S1, S2 present, regular. No murmur appreciated.  ABDOMEN: Soft, nontender. Bowel sounds present. No organomegaly.  SKIN: No rashes, but slight redness of right lower extremity.  JOINTS:  No swelling or tenderness.  LEGS:  Right lower limb up to thigh is swollen and tender compared to the left.  NEUROLOGICAL: Power 5/5, moves all 4 limbs.  PSYCHIATRIC: Currently anxious.  LABORATORY AND DIAGNOSTIC DATA:  Ultrasound Doppler study of lower extremities extensive thrombus throughout, common femoral, superficial femoral, popliteal vein on the right side.  No flow demonstrated in these vessels. CT angiogram of the chest showed no evidence of acute pulmonary embolism, no acute thoracic or aortic pathology.  No evidence of CHF or pneumonia.  The  thorax and esophagus exhibits no acute abnormality. Bony thorax is normal. Glucose 99, BUN 10, creatinine 0.95, sodium 130 fibrillation 3.6, chloride 98, CO2 24, total protein 8.6, albumin 4.1, bilirubin 0.5, alkaline phosphate 113, SGOT 55, SGPT 16. Troponin less than 0.02. WBC 8.9, hemoglobin 12.5, platelet count 216 and MCV 100. INR 1.1, activated PTT 33.7.   ASSESSMENT AND PLAN: A 39 year old female with past medical history of mania and bipolar disorder with anxiety and a few miscarriages in the past, with a family history of having clots in the mother at 2430s and 4040s in age.  IMPRESSION:   1.  Right leg deep vein thrombosis. Will give the patient Lovenox 1 mg per kg subcutaneous twice daily and send hypercoagulability work-up:  Protein C, protein S, antithrombin and Factor V Leiden, oncology consult for further management as she will be most likely having one of the abnormalities leading her to clot.  We need case manager and assistant as the patient does not have insurance and she will need anticoagulation therapy for a long time.  2.  Mania and bipolar disorder with anxiety. We will continue on her psychiatric medications in the hospital as she was taking before.  3.  Gastrointestinal prophylaxis. We will give her Nexium oral. 4.  Pain in the right lower limb. This is due to her deep vein thrombosis.  We will give Percocet as needed basis for pain for now.  Time spent on this admission: 50 minutes.    ____________________________ Hope PigeonVaibhavkumar G. Elisabeth PigeonVachhani, MD vgv:ct D: 10/23/2012 08:52:56 ET T: 10/23/2012 10:13:13 ET JOB#: [id number]  cc: Hope PigeonVaibhavkumar G. Elisabeth PigeonVachhani, MD, <Dictator> Altamese DillingVAIBHAVKUMAR Jesstin Studstill MD ELECTRONICALLY SIGNED 11/04/2012 14:19

## 2014-10-09 ENCOUNTER — Telehealth: Payer: Self-pay | Admitting: *Deleted

## 2014-10-09 NOTE — Telephone Encounter (Signed)
PT. WAS TOLD SHE WOULD BE REFERRED TO DR.SHERRILL AND HAS NOT BEEN CALLED WITH AN APPOINTMENT. PT. HAS ENOUGH LOVENOX FOR HER Wednesday MORNING DOSE BUT NONE FOR THE EVENING DOSE. NOTIFIED TIFFANY MCRAE. SHE WILL CONTACT PT. CONCERNING AN APPOINTMENT.

## 2014-10-11 ENCOUNTER — Telehealth: Payer: Self-pay | Admitting: Oncology

## 2014-10-11 NOTE — Telephone Encounter (Signed)
[phone].

## 2014-10-19 ENCOUNTER — Encounter: Payer: Self-pay | Admitting: *Deleted

## 2014-10-19 ENCOUNTER — Emergency Department
Admission: EM | Admit: 2014-10-19 | Discharge: 2014-10-19 | Disposition: A | Discharge status: Home / Self Care | Payer: Medicare Other | Attending: Emergency Medicine | Admitting: Emergency Medicine

## 2014-10-19 DIAGNOSIS — F309 Manic episode, unspecified: Secondary | ICD-10-CM | POA: Insufficient documentation

## 2014-10-19 DIAGNOSIS — Z7901 Long term (current) use of anticoagulants: Secondary | ICD-10-CM | POA: Diagnosis not present

## 2014-10-19 DIAGNOSIS — Z008 Encounter for other general examination: Secondary | ICD-10-CM | POA: Diagnosis present

## 2014-10-19 DIAGNOSIS — Z792 Long term (current) use of antibiotics: Secondary | ICD-10-CM | POA: Diagnosis not present

## 2014-10-19 DIAGNOSIS — Z87891 Personal history of nicotine dependence: Secondary | ICD-10-CM | POA: Diagnosis not present

## 2014-10-19 DIAGNOSIS — Z3202 Encounter for pregnancy test, result negative: Secondary | ICD-10-CM | POA: Insufficient documentation

## 2014-10-19 DIAGNOSIS — Z79899 Other long term (current) drug therapy: Secondary | ICD-10-CM | POA: Diagnosis not present

## 2014-10-19 LAB — CBC
HCT: 38 % (ref 35.0–47.0)
HEMOGLOBIN: 12.7 g/dL (ref 12.0–16.0)
MCH: 32.9 pg (ref 26.0–34.0)
MCHC: 33.4 g/dL (ref 32.0–36.0)
MCV: 98.3 fL (ref 80.0–100.0)
Platelets: 199 10*3/uL (ref 150–440)
RBC: 3.86 MIL/uL (ref 3.80–5.20)
RDW: 14.4 % (ref 11.5–14.5)
WBC: 5.6 10*3/uL (ref 3.6–11.0)

## 2014-10-19 LAB — ETHANOL

## 2014-10-19 LAB — SALICYLATE LEVEL: Salicylate Lvl: <4 mg/dL (ref 2.8–30.0)

## 2014-10-19 LAB — URINE DRUG SCREEN, QUALITATIVE (ARMC ONLY)
Amphetamines, Ur Screen: NOT DETECTED
BARBITURATES, UR SCREEN: NOT DETECTED
Benzodiazepine, Ur Scrn: NOT DETECTED
CANNABINOID 50 NG, UR ~~LOC~~: NOT DETECTED
COCAINE METABOLITE, UR ~~LOC~~: NOT DETECTED
MDMA (ECSTASY) UR SCREEN: NOT DETECTED
Methadone Scn, Ur: NOT DETECTED
Opiate, Ur Screen: NOT DETECTED
PHENCYCLIDINE (PCP) UR S: NOT DETECTED
Tricyclic, Ur Screen: NOT DETECTED

## 2014-10-19 LAB — POCT PREGNANCY, URINE: Preg Test, Ur: NEGATIVE

## 2014-10-19 LAB — COMPREHENSIVE METABOLIC PANEL
ALBUMIN: 4.9 g/dL (ref 3.5–5.0)
ALT: 22 U/L (ref 14–54)
AST: 44 U/L — AB (ref 15–41)
Alkaline Phosphatase: 47 U/L (ref 38–126)
Anion gap: 7 (ref 5–15)
BILIRUBIN TOTAL: 0.5 mg/dL (ref 0.3–1.2)
BUN: 21 mg/dL — ABNORMAL HIGH (ref 6–20)
CALCIUM: 9.1 mg/dL (ref 8.9–10.3)
CHLORIDE: 100 mmol/L — AB (ref 101–111)
CO2: 28 mmol/L (ref 22–32)
CREATININE: 0.72 mg/dL (ref 0.44–1.00)
GFR calc Af Amer: >60 mL/min (ref >60)
Glucose, Bld: 90 mg/dL (ref 65–99)
Potassium: 3.4 mmol/L — ABNORMAL LOW (ref 3.5–5.1)
SODIUM: 135 mmol/L (ref 135–145)
Total Protein: 7.7 g/dL (ref 6.5–8.1)

## 2014-10-19 LAB — ACETAMINOPHEN LEVEL: Acetaminophen (Tylenol), Serum: <10 ug/mL — ABNORMAL LOW (ref 10–30)

## 2014-10-19 MED ORDER — RISPERIDONE 1 MG PO TABS
ORAL_TABLET | ORAL | Status: AC
  Filled 2014-10-19: qty 2

## 2014-10-19 MED ORDER — RISPERIDONE 1 MG PO TABS
2.0000 mg | ORAL_TABLET | Freq: Once | ORAL | Status: AC
  Administered 2014-10-19: 2 mg via ORAL
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
## 2014-10-19 NOTE — ED Notes (Signed)
BEHAVIORAL HEALTH ROUNDING Patient sleeping: No. Patient alert and oriented: yes Behavior appropriate: Yes.  ; If no, describe:  Nutrition and fluids offered: Yes  Toileting and hygiene offered: Yes  Sitter present: yes Law enforcement present: Yes  

## 2014-10-19 NOTE — ED Notes (Signed)

## 2014-10-19 NOTE — ED Notes (Signed)
Pt reports her psychiatrist started her on Raylor for bipolar last week, and reports she feels like she has been more manic since taking the medication. "I am having really bad mania, I can't stop talking, I can't stop moving, I can't sleep." Pt denies SI or HI.

## 2014-10-19 NOTE — ED Notes (Signed)
Neg Preg
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
## 2014-10-19 NOTE — Discharge Instructions (Signed)
[facility] in the morning to set up follow up very soon - before your Tuesday appointment.   Please return for any further problems. Bipolar Disorder Bipolar disorder is a mental illness. The term bipolar disorder actually is used to describe a group of disorders that all share varying degrees of emotional highs and lows that can interfere with daily functioning, such as work, school, or relationships. Bipolar disorder also can lead to drug abuse, hospitalization, and suicide. The emotional highs of bipolar disorder are periods of elation or irritability and high energy. These highs can range from a mild form (hypomania) to a severe form (mania). People experiencing episodes of hypomania may appear energetic, excitable, and highly productive. People experiencing mania may behave impulsively or erratically. They often make poor decisions. They may have difficulty sleeping. The most severe episodes of mania can involve having very distorted beliefs or perceptions about the world and seeing or hearing things that are not real (psychotic delusions and hallucinations).  The emotional lows of bipolar disorder (depression) also can range from mild to severe. Severe episodes of bipolar depression can involve psychotic delusions and hallucinations. Sometimes people with bipolar disorder experience a state of mixed mood. Symptoms of hypomania or mania and depression are both present during this mixed-mood episode. SIGNS AND SYMPTOMS There are signs and symptoms of the episodes of hypomania and mania as well as the episodes of depression. The signs and symptoms of hypomania and mania are similar but vary in severity. They include:  Inflated self-esteem or feeling of increased self-confidence.  Decreased need for sleep.  Unusual talkativeness (rapid or pressured speech) or the feeling of a need to keep talking.  Sensation of racing thoughts or constant talking, with quick  shifts between topics that may or may not be related (flight of ideas).  Decreased ability to focus or concentrate.  Increased purposeful activity, such as work, studies, or social activity, or nonproductive activity, such as pacing, squirming and fidgeting, or finger and toe tapping.  Impulsive behavior and use of poor judgment, resulting in high-risk activities, such as having unprotected sex or spending excessive amounts of money. Signs and symptoms of depression include the following:   Feelings of sadness, hopelessness, or helplessness.  Frequent or uncontrollable episodes of crying.  Lack of feeling anything or caring about anything.  Difficulty sleeping or sleeping too much.  Inability to enjoy the things you used to enjoy.   Desire to be alone all the time.   Feelings of guilt or worthlessness.  Lack of energy or motivation.   Difficulty concentrating, remembering, or making decisions.  Change in appetite or weight beyond normal fluctuations.  Thoughts of death or the desire to harm yourself. DIAGNOSIS  Bipolar disorder is diagnosed through an assessment by your caregiver. Your caregiver will ask questions about your emotional episodes. There are two main types of bipolar disorder. People with type I bipolar disorder have manic episodes with or without depressive episodes. People with type II bipolar disorder have hypomanic episodes and major depressive episodes, which are more serious than mild depression. The type of bipolar disorder you have can make an important difference in how your illness is monitored and treated. Your caregiver may ask questions about your medical history and use of alcohol or drugs, including prescription medication. Certain medical conditions and substances also can cause emotional highs and lows that resemble bipolar disorder (secondary bipolar disorder).  TREATMENT  Bipolar disorder is a long-term illness. It is  best controlled with  continuous treatment rather than treatment only when symptoms occur. The following treatments can be prescribed for bipolar disorders:  Medication--Medication can be prescribed by a doctor that is an expert in treating mental disorders (psychiatrists). Medications called mood stabilizers are usually prescribed to help control the illness. Other medications are sometimes added if symptoms of mania, depression, or psychotic delusions and hallucinations occur despite the use of a mood stabilizer.  Talk therapy--Some forms of talk therapy are helpful in providing support, education, and guidance. A combination of medication and talk therapy is best for managing the disorder over time. A procedure in which electricity is applied to your brain through your scalp (electroconvulsive therapy) is used in cases of severe mania when medication and talk therapy do not work or work too slowly. Document Released: 09/01/2000 Document Revised: 09/20/2012 Document Reviewed: 06/21/2012 [facility] Patient Information 2015 [place], [place]. This information is not intended to replace advice given to you by your health care provider. Make sure you discuss any questions you have with your health care provider.

## 2014-10-24 ENCOUNTER — Ambulatory Visit: Payer: Self-pay | Admitting: Internal Medicine

## 2014-10-27 ENCOUNTER — Telehealth: Payer: Self-pay | Admitting: *Deleted

## 2014-10-27 NOTE — Telephone Encounter (Signed)
Spoke with Dr. Stoney Bangorcorcan. Pt needs to come in the office to get routine labs cbc, creatinine and her weight checked. md is not able to refill RX until patient comes into the office for this.  Pt can also go to the acute care or emergency room for this refill services.  This was offered to the patient. The patient states that she will just come in on Tuesday at her apt with Dr. Lorre Dougherty. She states that she does not want to come into today for her labs.

## 2014-10-27 NOTE — Telephone Encounter (Signed)
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  She is requesting a RF on her lovenox 60 mg injection which she takes every 12h as directed.  She was last seen in ED in Feb 2016 and was given 2 RFs on her Lovenox.  She was told that she was going to be on Lovenox for the "rest of her life due to a genetic disposition for blood clots."  She stated "I called and the ED provide could not give me any more Lovenox and I will be out tomorrow." "I have an apt in [place] on Tuesday to see an oncologist. I don't know his name to get more lovenox but I can not wait until then." pt very upset and expressed "fear of potentially missing her dose of Lovenox" if she is "not able to get her medication".  She states that she does not have a PCP.  I reviewed the chart and she has an apt [place].  She has seen Dr. Orlie DakinFinnegan on Oct 23 2012 in consultation in the hospital.  However, patient did not have a f/u in the cancer center as Dr. Orlie DakinFinnegan recommended.  Pt wants on call md to provide her with lovenox until she can come to apt on Tuesday with Dr. Lorre NickGittin.  Please advise. Thanks.

## 2014-10-31 ENCOUNTER — Ambulatory Visit: Payer: Self-pay | Admitting: Internal Medicine

## 2014-11-03 ENCOUNTER — Ambulatory Visit: Payer: Self-pay | Admitting: Oncology

## 2014-11-07 ENCOUNTER — Ambulatory Visit: Payer: Self-pay | Admitting: Internal Medicine

## 2014-11-12 ENCOUNTER — Emergency Department: Payer: Medicare Other

## 2014-11-12 ENCOUNTER — Encounter: Payer: Self-pay | Admitting: Emergency Medicine

## 2014-11-12 ENCOUNTER — Emergency Department
Admission: EM | Admit: 2014-11-12 | Discharge: 2014-11-12 | Disposition: A | Discharge status: Home / Self Care | Payer: Medicare Other | Attending: Emergency Medicine | Admitting: Emergency Medicine

## 2014-11-12 DIAGNOSIS — S60512A Abrasion of left hand, initial encounter: Secondary | ICD-10-CM | POA: Insufficient documentation

## 2014-11-12 DIAGNOSIS — S70312A Abrasion, left thigh, initial encounter: Secondary | ICD-10-CM | POA: Insufficient documentation

## 2014-11-12 DIAGNOSIS — Y9389 Activity, other specified: Secondary | ICD-10-CM | POA: Diagnosis not present

## 2014-11-12 DIAGNOSIS — S4992XA Unspecified injury of left shoulder and upper arm, initial encounter: Secondary | ICD-10-CM | POA: Insufficient documentation

## 2014-11-12 DIAGNOSIS — S0083XA Contusion of other part of head, initial encounter: Secondary | ICD-10-CM | POA: Insufficient documentation

## 2014-11-12 DIAGNOSIS — S0990XA Unspecified injury of head, initial encounter: Secondary | ICD-10-CM | POA: Diagnosis present

## 2014-11-12 DIAGNOSIS — Y998 Other external cause status: Secondary | ICD-10-CM | POA: Diagnosis not present

## 2014-11-12 DIAGNOSIS — Z79899 Other long term (current) drug therapy: Secondary | ICD-10-CM | POA: Diagnosis not present

## 2014-11-12 DIAGNOSIS — S50311A Abrasion of right elbow, initial encounter: Secondary | ICD-10-CM | POA: Insufficient documentation

## 2014-11-12 DIAGNOSIS — T148XXA Other injury of unspecified body region, initial encounter: Secondary | ICD-10-CM

## 2014-11-12 DIAGNOSIS — S60511A Abrasion of right hand, initial encounter: Secondary | ICD-10-CM | POA: Insufficient documentation

## 2014-11-12 DIAGNOSIS — S50312A Abrasion of left elbow, initial encounter: Secondary | ICD-10-CM | POA: Diagnosis not present

## 2014-11-12 DIAGNOSIS — Y9289 Other specified places as the place of occurrence of the external cause: Secondary | ICD-10-CM | POA: Insufficient documentation

## 2014-11-12 DIAGNOSIS — Z87891 Personal history of nicotine dependence: Secondary | ICD-10-CM | POA: Insufficient documentation

## 2014-11-12 MED ORDER — CLONAZEPAM 1 MG PO TABS
1.0000 mg | ORAL_TABLET | Freq: Once | ORAL | Status: AC
  Administered 2014-11-12: 1 mg via ORAL

## 2014-11-12 MED ORDER — TETANUS-DIPHTH-ACELL PERTUSSIS 5-2.5-18.5 LF-MCG/0.5 IM SUSP
INTRAMUSCULAR | Status: AC
  Administered 2014-11-12: 0.5 mL via INTRAMUSCULAR
  Filled 2014-11-12: qty 0.5

## 2014-11-12 MED ORDER — TETANUS-DIPHTH-ACELL PERTUSSIS 5-2.5-18.5 LF-MCG/0.5 IM SUSP
0.5000 mL | Freq: Once | INTRAMUSCULAR | Status: AC
  Administered 2014-11-12: 0.5 mL via INTRAMUSCULAR

## 2014-11-12 MED ORDER — CLONAZEPAM 1 MG PO TABS
ORAL_TABLET | ORAL | Status: AC
  Administered 2014-11-12: 1 mg via ORAL
  Filled 2014-11-12: qty 1
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
## 2014-11-12 NOTE — Discharge Instructions (Signed)
Please seek medical attention for any high fevers, chest pain, shortness of breath, change in behavior, persistent vomiting, bloody stool or any other new or concerning symptoms.  Abrasion An abrasion is a cut or scrape of the skin. Abrasions do not extend through all layers of the skin and most heal within 10 days. It is important to care for your abrasion properly to prevent infection. CAUSES  Most abrasions are caused by falling on, or gliding across, the ground or other surface. When your skin rubs on something, the outer and inner layer of skin rubs off, causing an abrasion. DIAGNOSIS  Your caregiver will be able to diagnose an abrasion during a physical exam.  TREATMENT  Your treatment depends on how large and deep the abrasion is. Generally, your abrasion will be cleaned with water and a mild soap to remove any dirt or debris. An antibiotic ointment may be put over the abrasion to prevent an infection. A bandage (dressing) may be wrapped around the abrasion to keep it from getting dirty.  You may need a tetanus shot if:  You cannot remember when you had your last tetanus shot.  You have never had a tetanus shot.  The injury broke your skin. If you get a tetanus shot, your arm may swell, get red, and feel warm to the touch. This is common and not a problem. If you need a tetanus shot and you choose not to have one, there is a rare chance of getting tetanus. Sickness from tetanus can be serious.  HOME CARE INSTRUCTIONS   If a dressing was applied, change it at least once a day or as directed by your caregiver. If the bandage sticks, soak it off with warm water.   Wash the area with water and a mild soap to remove all the ointment 2 times a day. Rinse off the soap and pat the area dry with a clean towel.   Reapply any ointment as directed by your caregiver. This will help prevent infection and keep the bandage from sticking. Use gauze over the wound and under the dressing to help keep  the bandage from sticking.   Change your dressing right away if it becomes wet or dirty.   Only take over-the-counter or prescription medicines for pain, discomfort, or fever as directed by your caregiver.   Follow up with your caregiver within 24-48 hours for a wound check, or as directed. If you were not given a wound-check appointment, look closely at your abrasion for redness, swelling, or pus. These are signs of infection. SEEK IMMEDIATE MEDICAL CARE IF:   You have increasing pain in the wound.   You have redness, swelling, or tenderness around the wound.   You have pus coming from the wound.   You have a fever or persistent symptoms for more than 2-3 days.  You have a fever and your symptoms suddenly get worse.  You have a bad smell coming from the wound or dressing.  MAKE SURE YOU:   Understand these instructions.  Will watch your condition.  Will get help right away if you are not doing well or get worse. Document Released: 03/05/2005 Document Revised: 05/12/2012 Document Reviewed: 04/29/2011 [facility] Patient Information 2015 [place], [place]. This information is not intended to replace advice given to you by your health care provider. Make sure you discuss any questions you have with your health care provider.  Contusion A contusion is a deep bruise. Contusions are the result of an injury that caused bleeding  under the skin. The contusion may turn blue, purple, or yellow. Minor injuries will give you a painless contusion, but more severe contusions may stay painful and swollen for a few weeks.  CAUSES  A contusion is usually caused by a blow, trauma, or direct force to an area of the body. SYMPTOMS   Swelling and redness of the injured area.  Bruising of the injured area.  Tenderness and soreness of the injured area.  Pain. DIAGNOSIS  The diagnosis can be made by taking a history and physical exam. An X-ray, CT scan, or MRI may be needed to determine if  there were any associated injuries, such as fractures. TREATMENT  Specific treatment will depend on what area of the body was injured. In general, the best treatment for a contusion is resting, icing, elevating, and applying cold compresses to the injured area. Over-the-counter medicines may also be recommended for pain control. Ask your caregiver what the best treatment is for your contusion. HOME CARE INSTRUCTIONS   Put ice on the injured area.  Put ice in a plastic bag.  Place a towel between your skin and the bag.  Leave the ice on for 15-20 minutes, 3-4 times a day, or as directed by your health care provider.  Only take over-the-counter or prescription medicines for pain, discomfort, or fever as directed by your caregiver. Your caregiver may recommend avoiding anti-inflammatory medicines (aspirin, ibuprofen, and naproxen) for 48 hours because these medicines may increase bruising.  Rest the injured area.  If possible, elevate the injured area to reduce swelling. SEEK IMMEDIATE MEDICAL CARE IF:   You have increased bruising or swelling.  You have pain that is getting worse.  Your swelling or pain is not relieved with medicines. MAKE SURE YOU:   Understand these instructions.  Will watch your condition.  Will get help right away if you are not doing well or get worse. Document Released: 03/05/2005 Document Revised: 05/31/2013 Document Reviewed: 03/31/2011 [facility] Patient Information 2015 [place], [place]. This information is not intended to replace advice given to you by your health care provider. Make sure you discuss any questions you have with your health care provider.
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
## 2014-11-12 NOTE — ED Notes (Signed)
Patient declined any further treatment at this time. Refusing CT scan.  Dr. Derrill KayGoodman aware.
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
## 2014-11-17 ENCOUNTER — Ambulatory Visit: Payer: Self-pay | Admitting: Oncology

## 2014-12-01 ENCOUNTER — Telehealth: Payer: Self-pay | Admitting: *Deleted

## 2014-12-01 NOTE — Telephone Encounter (Signed)
VM message received from patient @ 1450 regarding lovenox refill.  Pt is seen @ [facility]. LVM on identified phone with phone # to [facility].

## 2014-12-08 ENCOUNTER — Ambulatory Visit: Payer: Self-pay | Admitting: Oncology

## 2014-12-15 ENCOUNTER — Other Ambulatory Visit: Payer: Self-pay | Admitting: *Deleted

## 2014-12-15 ENCOUNTER — Inpatient Hospital Stay: Payer: Medicare Other | Attending: Oncology | Admitting: Oncology

## 2014-12-15 ENCOUNTER — Encounter: Payer: Self-pay | Admitting: Oncology

## 2014-12-15 VITALS — BP 141/58 | HR 95 | Temp 99.3°F | Wt 143.1 lb

## 2014-12-15 DIAGNOSIS — D649 Anemia, unspecified: Secondary | ICD-10-CM | POA: Diagnosis not present

## 2014-12-15 DIAGNOSIS — Z79899 Other long term (current) drug therapy: Secondary | ICD-10-CM | POA: Diagnosis not present

## 2014-12-15 DIAGNOSIS — D689 Coagulation defect, unspecified: Secondary | ICD-10-CM | POA: Diagnosis not present

## 2014-12-15 DIAGNOSIS — M79605 Pain in left leg: Secondary | ICD-10-CM | POA: Diagnosis not present

## 2014-12-15 DIAGNOSIS — I82401 Acute embolism and thrombosis of unspecified deep veins of right lower extremity: Secondary | ICD-10-CM

## 2014-12-15 DIAGNOSIS — F419 Anxiety disorder, unspecified: Secondary | ICD-10-CM | POA: Diagnosis not present

## 2014-12-15 DIAGNOSIS — Z86718 Personal history of other venous thrombosis and embolism: Secondary | ICD-10-CM | POA: Diagnosis present

## 2014-12-15 DIAGNOSIS — I82409 Acute embolism and thrombosis of unspecified deep veins of unspecified lower extremity: Secondary | ICD-10-CM

## 2014-12-15 DIAGNOSIS — M79604 Pain in right leg: Secondary | ICD-10-CM | POA: Diagnosis not present

## 2014-12-15 DIAGNOSIS — F329 Major depressive disorder, single episode, unspecified: Secondary | ICD-10-CM | POA: Diagnosis not present
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
## 2014-12-19 LAB — LUPUS ANTICOAGULANT PANEL
DRVVT: 34.4 s (ref 0.0–55.1)
PTT LA: 34.2 s (ref 0.0–50.0)

## 2014-12-22 ENCOUNTER — Telehealth: Payer: Self-pay | Admitting: *Deleted

## 2014-12-25 NOTE — Telephone Encounter (Signed)
Results were negative. No f/u needed.

## 2014-12-25 NOTE — Telephone Encounter (Signed)
Left msg on vm that results are neagative and no need to fu here

## 2014-12-25 NOTE — Telephone Encounter (Signed)
Forwarded to Dr. Finnegan.  

## 2015-01-01 NOTE — Progress Notes (Signed)
[facility]  [phone] [phone]  ID: Elinor Parkinson OB: 20-Nov-1975  MR#: [id number]  AOZ#:[id number]  Patient Care Team: No Pcp Per Patient as PCP - General (General Practice)  CHIEF COMPLAINT:  Chief Complaint  Patient presents with  . New Evaluation    INTERVAL HISTORY: Patient last evaluated as an inpatient consult in May 2014. She is a 39 year old female with history of multiple DVTs. She most recently was on Lovenox, but has had none in greater than a month. She is highly anxious and appears to be slightly manic. She complains of significant leg pain. She also reports leg swelling, but resolved within 1-2 days without intervention. Patient states her mother had a DVT in her 30s. She has no neurologic complaints.  She denies any recent fevers.  She has no chest pain or shortness of breath.  She has a good appetite and denies weight loss.  She has no nausea, vomiting, constipation, or diarrhea.  She has no urinary complaints.  Patient offers no further specific complaints.  REVIEW OF SYSTEMS:   Review of Systems  Constitutional: Negative.   Respiratory: Negative.   Cardiovascular: Negative.   Musculoskeletal:       Leg pain.  Neurological: Negative.   Psychiatric/Behavioral: Positive for depression. The patient is nervous/anxious.     As per HPI. Otherwise, a complete review of systems is negatve.  PAST MEDICAL HISTORY: Past Medical History  Diagnosis Date  . DVT (deep venous thrombosis) 01/2014  . Anemia   . Clotting disorder     PAST SURGICAL HISTORY: Past Surgical History  Procedure Laterality Date  . Tonsillectomy    . Breast reconstruction Bilateral 2000    breast augumentation  . Dilation and curettage of uterus  July, 2015     miscarriage @ 10 weeks     FAMILY HISTORY: Mother with reported DVT, otherwise negative and noncontributory.     ADVANCED DIRECTIVES:    HEALTH MAINTENANCE: History  Substance Use  Topics  . Smoking status: Former Smoker    Types: Cigarettes    Quit date: 01/26/2014  . Smokeless tobacco: Not on file     Comment: smokes electronic cigarettes  . Alcohol Use: Yes     Comment: social      Colonoscopy:  PAP:  Bone density:  Lipid panel:  No Known Allergies  Current Outpatient Prescriptions  Medication Sig Dispense Refill  . clonazePAM (KLONOPIN) 1 MG tablet Take 1 tablet (1 mg total) by mouth 3 (three) times daily. (Patient taking differently: Take 1 mg by mouth 2 (two) times daily. ) 30 tablet 0  . enoxaparin (LOVENOX) 60 MG/0.6ML injection Inject 0.6 mLs (60 mg total) into the skin every 12 (twelve) hours. 60 Syringe 2  . traZODone (DESYREL) 50 MG tablet Take 100 mg by mouth at bedtime.     . ciprofloxacin (CIPRO) 500 MG tablet Take 1 tablet (500 mg total) by mouth 2 (two) times daily. (Patient not taking: Reported on 07/19/2014) 6 tablet 0  . oxyCODONE-acetaminophen (PERCOCET/ROXICET) 5-325 MG per tablet Take 1-2 tablets by mouth every 6 (six) hours as needed for moderate pain or severe pain. (Patient not taking: Reported on 12/15/2014) 10 tablet 0  . prazosin (MINIPRESS) 2 MG capsule Take 10 mg by mouth at bedtime.     . prazosin (MINIPRESS) 5 MG capsule Take by mouth.     No current facility-administered medications for this visit.    OBJECTIVE: Filed Vitals:   12/15/14 1502  BP: 141/58  Pulse: 95  Temp: 99.3 F (37.4 C)     Body mass index is 23.81 kg/(m^2).    ECOG FS:0 - Asymptomatic  General: Well-developed, well-nourished, no acute distress. Eyes: Pink conjunctiva, anicteric sclera. HEENT: Normocephalic, moist mucous membranes, clear oropharnyx. Lungs: Clear to auscultation bilaterally. Heart: Regular rate and rhythm. No rubs, murmurs, or gallops. Abdomen: Soft, nontender, nondistended. No organomegaly noted, normoactive bowel sounds. Musculoskeletal: No edema, cyanosis, or clubbing. Neuro: Alert, answering all questions appropriately. Cranial  nerves grossly intact. Skin: No rashes or petechiae noted. Psych: Normal affect. Lymphatics: No cervical, calvicular, axillary or inguinal LAD.   LAB RESULTS:  Lab Results  Component Value Date   NA 135 10/19/2014   K 3.4* 10/19/2014   CL 100* 10/19/2014   CO2 28 10/19/2014   GLUCOSE 90 10/19/2014   BUN 21* 10/19/2014   CREATININE 0.72 10/19/2014   CALCIUM 9.1 10/19/2014   PROT 7.7 10/19/2014   ALBUMIN 4.9 10/19/2014   AST 44* 10/19/2014   ALT 22 10/19/2014   ALKPHOS 47 10/19/2014   BILITOT 0.5 10/19/2014   GFRNONAA >60 10/19/2014   GFRAA >60 10/19/2014    Lab Results  Component Value Date   WBC 5.6 10/19/2014   NEUTROABS 4.8 07/19/2014   HGB 12.7 10/19/2014   HCT 38.0 10/19/2014   MCV 98.3 10/19/2014   PLT 199 10/19/2014     STUDIES: No results found.  ASSESSMENT: History of DVT.  PLAN:    1. History of DVT: Previously, patient's entire hypercoagulable workup including factor V 5 Leiden and prothrombin gene mutation was reported as negative. She did have a positive lupus anticoagulate at that time, which has been repeated and is negative. No intervention is needed at this time. The risks of life long anticoagulation in this particular patient outweigh risk of repeated DVT. Patient also states she is getting married and wishes to have children, therefore will refuse any anticoagulation but Lovenox.  If patient had another DVT, would consider lifelong treatment at that time. No follow-up has been scheduled.  Approximately 30 minutes was spent in discussion and consultation.  Patient expressed understanding and was in agreement with this plan. She also understands that She can call clinic at any time with any questions, concerns, or complaints.    Jeralyn Ruths, MD   01/01/2015 5:42 PM

## 2015-01-17 ENCOUNTER — Inpatient Hospital Stay (HOSPITAL_COMMUNITY): Payer: Medicare Other

## 2015-01-17 ENCOUNTER — Encounter (HOSPITAL_COMMUNITY): Payer: Self-pay

## 2015-01-17 ENCOUNTER — Encounter (HOSPITAL_COMMUNITY): Payer: Self-pay | Admitting: Emergency Medicine

## 2015-01-17 ENCOUNTER — Emergency Department (HOSPITAL_COMMUNITY)
Admission: EM | Admit: 2015-01-17 | Discharge: 2015-01-17 | Discharge status: Against Medical Advice | Payer: Medicare Other | Attending: Emergency Medicine | Admitting: Emergency Medicine

## 2015-01-17 ENCOUNTER — Inpatient Hospital Stay (HOSPITAL_COMMUNITY)
Admission: AD | Admit: 2015-01-17 | Discharge: 2015-01-18 | Disposition: A | Discharge status: Home / Self Care | Payer: Medicare Other | Source: Ambulatory Visit | Attending: Obstetrics & Gynecology | Admitting: Obstetrics & Gynecology

## 2015-01-17 DIAGNOSIS — R197 Diarrhea, unspecified: Secondary | ICD-10-CM | POA: Diagnosis not present

## 2015-01-17 DIAGNOSIS — R102 Pelvic and perineal pain: Secondary | ICD-10-CM | POA: Diagnosis not present

## 2015-01-17 DIAGNOSIS — Z87891 Personal history of nicotine dependence: Secondary | ICD-10-CM | POA: Insufficient documentation

## 2015-01-17 DIAGNOSIS — O26891 Other specified pregnancy related conditions, first trimester: Secondary | ICD-10-CM

## 2015-01-17 DIAGNOSIS — R109 Unspecified abdominal pain: Secondary | ICD-10-CM | POA: Diagnosis not present

## 2015-01-17 DIAGNOSIS — O9989 Other specified diseases and conditions complicating pregnancy, childbirth and the puerperium: Secondary | ICD-10-CM | POA: Diagnosis not present

## 2015-01-17 DIAGNOSIS — O09521 Supervision of elderly multigravida, first trimester: Secondary | ICD-10-CM | POA: Insufficient documentation

## 2015-01-17 DIAGNOSIS — R1110 Vomiting, unspecified: Secondary | ICD-10-CM | POA: Insufficient documentation

## 2015-01-17 DIAGNOSIS — R6883 Chills (without fever): Secondary | ICD-10-CM | POA: Insufficient documentation

## 2015-01-17 DIAGNOSIS — Z3A01 Less than 8 weeks gestation of pregnancy: Secondary | ICD-10-CM | POA: Insufficient documentation

## 2015-01-17 DIAGNOSIS — R1032 Left lower quadrant pain: Secondary | ICD-10-CM | POA: Insufficient documentation

## 2015-01-17 DIAGNOSIS — O3680X0 Pregnancy with inconclusive fetal viability, not applicable or unspecified: Secondary | ICD-10-CM

## 2015-01-17 HISTORY — DX: Urinary tract infection, site not specified: N39.0

## 2015-01-17 HISTORY — DX: Manic episode, unspecified: F30.9

## 2015-01-17 LAB — URINALYSIS, ROUTINE W REFLEX MICROSCOPIC
Bilirubin Urine: NEGATIVE
GLUCOSE, UA: NEGATIVE mg/dL
Hgb urine dipstick: NEGATIVE
Ketones, ur: NEGATIVE mg/dL
Leukocytes, UA: NEGATIVE
Nitrite: NEGATIVE
Protein, ur: NEGATIVE mg/dL
Specific Gravity, Urine: <1.005 — ABNORMAL LOW (ref 1.005–1.030)
Urobilinogen, UA: 0.2 mg/dL (ref 0.0–1.0)
pH: 7 (ref 5.0–8.0)

## 2015-01-17 LAB — COMPREHENSIVE METABOLIC PANEL
ALK PHOS: 48 U/L (ref 38–126)
ALT: 16 U/L (ref 14–54)
AST: 28 U/L (ref 15–41)
Albumin: 4.2 g/dL (ref 3.5–5.0)
Anion gap: 10 (ref 5–15)
BILIRUBIN TOTAL: 0.4 mg/dL (ref 0.3–1.2)
BUN: 15 mg/dL (ref 6–20)
CHLORIDE: 103 mmol/L (ref 101–111)
CO2: 23 mmol/L (ref 22–32)
Calcium: 9.5 mg/dL (ref 8.9–10.3)
Creatinine, Ser: 0.84 mg/dL (ref 0.44–1.00)
GFR calc non Af Amer: >60 mL/min (ref >60)
Glucose, Bld: 96 mg/dL (ref 65–99)
POTASSIUM: 3.8 mmol/L (ref 3.5–5.1)
Sodium: 136 mmol/L (ref 135–145)
TOTAL PROTEIN: 7.5 g/dL (ref 6.5–8.1)

## 2015-01-17 LAB — ABO/RH: ABO/RH(D): A POS

## 2015-01-17 LAB — WET PREP, GENITAL
TRICH WET PREP: NONE SEEN
Yeast Wet Prep HPF POC: NONE SEEN

## 2015-01-17 LAB — CBC
HEMATOCRIT: 37.5 % (ref 36.0–46.0)
Hemoglobin: 12.9 g/dL (ref 12.0–15.0)
MCH: 33 pg (ref 26.0–34.0)
MCHC: 34.4 g/dL (ref 30.0–36.0)
MCV: 95.9 fL (ref 78.0–100.0)
PLATELETS: 218 10*3/uL (ref 150–400)
RBC: 3.91 MIL/uL (ref 3.87–5.11)
RDW: 12.4 % (ref 11.5–15.5)
WBC: 6 10*3/uL (ref 4.0–10.5)

## 2015-01-17 LAB — HCG, QUANTITATIVE, PREGNANCY: hCG, Beta Chain, Quant, S: 1272 m[IU]/mL — ABNORMAL HIGH (ref <5)

## 2015-01-17 LAB — LIPASE, BLOOD: Lipase: 28 U/L (ref 22–51)

## 2015-01-17 MED ORDER — OXYCODONE-ACETAMINOPHEN 5-325 MG PO TABS
2.0000 | ORAL_TABLET | Freq: Once | ORAL | Status: AC
Start: 2015-01-17 — End: 2015-01-17
  Administered 2015-01-17: 2 via ORAL
  Filled 2015-01-17: qty 2

## 2015-01-17 NOTE — MAU Note (Signed)
Left lower abd sharp pain x 1 week.  Having chills.  History of 3 miscarriages.  Home pregnancy test positive last Wednesday. No bleeding.

## 2015-01-17 NOTE — ED Notes (Signed)
Pt approached nurse first and turned in her stickers and urine cup and stated she did not want to wait any longer and proceeded to leave.

## 2015-01-17 NOTE — ED Notes (Signed)
Pt. reports LLQ pain with emesis and diarrhea onset this week , mild chills / no fever , denies dysuria / no vaginal spotting . Pt. stated she is [redacted] weeks pregnant .

## 2015-01-17 NOTE — MAU Provider Note (Signed)
History     CSN: [id number]  Arrival date and time: 01/17/15 2046   First Provider Initiated Contact with Patient 01/17/15 2225      Chief Complaint  Patient presents with  . Abdominal Pain   HPI Comments: Jaclyn Dougherty is a 39 y.o. [id number] at Unknown who presents today with abdominal pain. She denies any vaginal bleeding.   Abdominal Pain This is a new problem. The current episode started in the past 7 days. The onset quality is gradual. The problem has been unchanged. The pain is located in the LLQ. The pain is at a severity of 8/10. The quality of the pain is cramping. The abdominal pain does not radiate. The pain is aggravated by being still. The pain is relieved by nothing. She has tried nothing for the symptoms. Prior workup: HSG on 12/20/14.     Past Medical History  Diagnosis Date  . DVT (deep venous thrombosis) 01/2014  . Anemia   . Clotting disorder   . UTI (lower urinary tract infection)   . Mania     Past Surgical History  Procedure Laterality Date  . Tonsillectomy    . Breast reconstruction Bilateral 2000    breast augumentation  . Dilation and curettage of uterus  July, 2015     miscarriage @ 10 weeks     History reviewed. No pertinent family history.  Social History  Substance Use Topics  . Smoking status: Former Smoker    Types: Cigarettes    Quit date: 01/26/2014  . Smokeless tobacco: None     Comment: smokes electronic cigarettes  . Alcohol Use: No     Comment: social     Allergies: No Known Allergies  Prescriptions prior to admission  Medication Sig Dispense Refill Last Dose  . clonazePAM (KLONOPIN) 1 MG tablet Take 1 tablet (1 mg total) by mouth 3 (three) times daily. (Patient taking differently: Take 1 mg by mouth 2 (two) times daily. ) 30 tablet 0 01/16/2015 at 1200  . OVER THE COUNTER MEDICATION Take 3 capsules by mouth daily. Herbal life   01/17/2015 at 1700  . prazosin (MINIPRESS) 5 MG capsule Take by mouth.   01/17/2015 at Unknown time   . prazosin (MINIPRESS) 5 MG capsule Take 10 mg by mouth at bedtime.   01/16/2015 at Unknown time  . traZODone (DESYREL) 50 MG tablet Take 100 mg by mouth at bedtime.    01/16/2015 at Unknown time  . enoxaparin (LOVENOX) 60 MG/0.6ML injection Inject 0.6 mLs (60 mg total) into the skin every 12 (twelve) hours. (Patient not taking: Reported on 01/17/2015) 60 Syringe 2 Not Taking at Unknown time  . oxyCODONE-acetaminophen (PERCOCET/ROXICET) 5-325 MG per tablet Take 1-2 tablets by mouth every 6 (six) hours as needed for moderate pain or severe pain. (Patient not taking: Reported on 12/15/2014) 10 tablet 0 Completed Course at Unknown time  . [DISCONTINUED] ciprofloxacin (CIPRO) 500 MG tablet Take 1 tablet (500 mg total) by mouth 2 (two) times daily. (Patient not taking: Reported on 07/19/2014) 6 tablet 0 Completed Course at Unknown time    Review of Systems  Gastrointestinal: Positive for abdominal pain.   Physical Exam   Blood pressure 114/63, pulse 89, temperature 98.3 F (36.8 C), temperature source Oral, resp. rate 16, height 5\' 5"  (1.651 m), weight 65.431 kg (144 lb 4 oz), last menstrual period 12/13/2014, SpO2 100 %.  Physical Exam  Nursing note and vitals reviewed. Constitutional: She is oriented to person, place, and time. She appears well-developed  and well-nourished. No distress.  HENT:  Head: Normocephalic.  Cardiovascular: Normal rate.   Respiratory: Effort normal.  GI: Soft. There is no tenderness. There is no rebound.  Genitourinary:   External: no lesion Vagina: small amount of white discharge Cervix: pink, smooth, no CMT Uterus: NSSC Adnexa: NT   Neurological: She is alert and oriented to person, place, and time.  Skin: Skin is warm and dry.  Psychiatric: She has a normal mood and affect.   Results for orders placed or performed during the hospital encounter of 01/17/15 (from the past 24 hour(s))  Urinalysis, Routine w reflex microscopic (not at [facility])     Status: Abnormal    Collection Time: 01/17/15  9:38 PM  Result Value Ref Range   Color, Urine YELLOW YELLOW   APPearance CLEAR CLEAR   Specific Gravity, Urine <1.005 (L) 1.005 - 1.030   pH 7.0 5.0 - 8.0   Glucose, UA NEGATIVE NEGATIVE mg/dL   Hgb urine dipstick NEGATIVE NEGATIVE   Bilirubin Urine NEGATIVE NEGATIVE   Ketones, ur NEGATIVE NEGATIVE mg/dL   Protein, ur NEGATIVE NEGATIVE mg/dL   Urobilinogen, UA 0.2 0.0 - 1.0 mg/dL   Nitrite NEGATIVE NEGATIVE   Leukocytes, UA NEGATIVE NEGATIVE  ABO/Rh     Status: None   Collection Time: 01/17/15 10:03 PM  Result Value Ref Range   ABO/RH(D) A POS   Wet prep, genital     Status: Abnormal   Collection Time: 01/17/15 10:35 PM  Result Value Ref Range   Yeast Wet Prep HPF POC NONE SEEN NONE SEEN   Trich, Wet Prep NONE SEEN NONE SEEN   Clue Cells Wet Prep HPF POC FEW (A) NONE SEEN   WBC, Wet Prep HPF POC FEW (A) NONE SEEN   US Ob Comp Less 14 Wks  01/18/2015   CLINICAL DATA:  Left pelvic pain for 1 week  EXAM: OBSTETRIC <14 WK [place] AND TRANSVAGINAL OB US  TECHNIQUE: Both transabdominal and transvaginal ultrasound examinations were performed for complete evaluation of the gestation as well as the maternal uterus, adnexal regions, and pelvic cul-de-sac. Transvaginal technique was performed to assess early pregnancy.  COMPARISON:  None.  FINDINGS: Intrauterine gestational sac: Visualized/normal in shape.  Yolk sac:  Not visualized  Embryo:  Not visualized  Cardiac Activity: Not visualized  MSD: 4 mm  4 w   6  d  Maternal uterus/adnexae: There is no demonstrable subchorionic hemorrhage. Cervical os is closed. There is no ovarian lesion on either side. There is no appreciable free pelvic fluid.  IMPRESSION: Tiny sac in the uterus, felt to represent a small gestational sac. Based on gestational sac size, gestational age is approximately 5 weeks. Fetal pole not seen. Advise correlation with beta HCG values and repeat ultrasound in approximately 10 to 14 days to assess for fetal  pole.   Electronically Signed   By: Bretta Bang III M.D.   On: 01/18/2015 00:48   US Ob Transvaginal  01/18/2015   CLINICAL DATA:  Left pelvic pain for 1 week  EXAM: OBSTETRIC <14 WK [place] AND TRANSVAGINAL OB US  TECHNIQUE: Both transabdominal and transvaginal ultrasound examinations were performed for complete evaluation of the gestation as well as the maternal uterus, adnexal regions, and pelvic cul-de-sac. Transvaginal technique was performed to assess early pregnancy.  COMPARISON:  None.  FINDINGS: Intrauterine gestational sac: Visualized/normal in shape.  Yolk sac:  Not visualized  Embryo:  Not visualized  Cardiac Activity: Not visualized  MSD: 4 mm  4 w  6  d  Maternal uterus/adnexae: There is no demonstrable subchorionic hemorrhage. Cervical os is closed. There is no ovarian lesion on either side. There is no appreciable free pelvic fluid.  IMPRESSION: Tiny sac in the uterus, felt to represent a small gestational sac. Based on gestational sac size, gestational age is approximately 5 weeks. Fetal pole not seen. Advise correlation with beta HCG values and repeat ultrasound in approximately 10 to 14 days to assess for fetal pole.   Electronically Signed   By: Bretta Bang III M.D.   On: 01/18/2015 00:48    MAU Course  Procedures  MDM   Assessment and Plan   1. Pregnancy of unknown anatomic location   2. Pelvic pain affecting pregnancy in first trimester, antepartum    DC home Comfort measures reviewed  1st Trimester precautions  Bleeding precautions Ectopic precautions RX: none  Return to MAU as needed FU with OB as planned  Follow-up Information    Follow up with [facility] [place] MATERN[facility] ADMISSIONS.   Why:  01/19/15 around 8:00 PM    Contact information:   [address] [id number] mc [place] [address] [phone]        Tawnya Crook 01/17/2015, 10:30 PM

## 2015-01-18 DIAGNOSIS — O26891 Other specified pregnancy related conditions, first trimester: Secondary | ICD-10-CM

## 2015-01-18 DIAGNOSIS — R102 Pelvic and perineal pain: Secondary | ICD-10-CM

## 2015-01-18 LAB — GC/CHLAMYDIA PROBE AMP (~~LOC~~) NOT AT ARMC
Chlamydia: NEGATIVE
Neisseria Gonorrhea: NEGATIVE

## 2015-01-18 LAB — HIV ANTIBODY (ROUTINE TESTING W REFLEX): HIV SCREEN 4TH GENERATION: NONREACTIVE

## 2015-01-18 NOTE — Discharge Instructions (Signed)
°Ectopic Pregnancy °An ectopic pregnancy is when the fertilized egg attaches (implants) outside the uterus. Most ectopic pregnancies occur in the fallopian tube. Rarely do ectopic pregnancies occur on the ovary, intestine, pelvis, or cervix. In an ectopic pregnancy, the fertilized egg does not have the ability to develop into a normal, healthy baby.  °A ruptured ectopic pregnancy is one in which the fallopian tube gets torn or bursts and results in internal bleeding. Often there is intense abdominal pain, and sometimes, vaginal bleeding. Having an ectopic pregnancy can be life threatening. If left untreated, this dangerous condition can lead to a blood transfusion, abdominal surgery, or even death. °CAUSES  °Damage to the fallopian tubes is the suspected cause in most ectopic pregnancies.  °RISK FACTORS °Depending on your circumstances, the risk of having an ectopic pregnancy will vary. The level of risk can be divided into three categories. °High Risk °· You have gone through infertility treatment. °· You have had a previous ectopic pregnancy. °· You have had previous tubal surgery. °· You have had previous surgery to have the fallopian tubes tied (tubal ligation). °· You have tubal problems or diseases. °· You have been exposed to DES. DES is a medicine that was used until 1971 and had effects on babies whose mothers took the medicine. °· You become pregnant while using an intrauterine device (IUD) for birth control.  °Moderate Risk °· You have a history of infertility. °· You have a history of a sexually transmitted infection (STI). °· You have a history of pelvic inflammatory disease (PID). °· You have scarring from endometriosis. °· You have multiple sexual partners. °· You smoke.  °Low Risk °· You have had previous pelvic surgery. °· You use vaginal douching. °· You became sexually active before 39 years of age. °SIGNS AND SYMPTOMS  °An ectopic pregnancy should be suspected in anyone who has missed a period  and has abdominal pain or bleeding. °· You may experience normal pregnancy symptoms, such as: °¨ Nausea. °¨ Tiredness. °¨ Breast tenderness. °· Other symptoms may include: °¨ Pain with intercourse. °¨ Irregular vaginal bleeding or spotting. °¨ Cramping or pain on one side or in the lower abdomen. °¨ Fast heartbeat. °¨ Passing out while having a bowel movement. °· Symptoms of a ruptured ectopic pregnancy and internal bleeding may include: °¨ Sudden, severe pain in the abdomen and pelvis. °¨ Dizziness or fainting. °¨ Pain in the shoulder area. °DIAGNOSIS  °Tests that may be performed include: °· A pregnancy test. °· An ultrasound test. °· Testing the specific level of pregnancy hormone in the bloodstream. °· Taking a sample of uterus tissue (dilation and curettage, D&C). °· Surgery to perform a visual exam of the inside of the abdomen using a thin, lighted tube with a tiny camera on the end (laparoscope). °TREATMENT  °An injection of a medicine called methotrexate may be given. This medicine causes the pregnancy tissue to be absorbed. It is given if: °· The diagnosis is made early. °· The fallopian tube has not ruptured. °· You are considered to be a good candidate for the medicine. °Usually, pregnancy hormone blood levels are checked after methotrexate treatment. This is to be sure the medicine is effective. It may take 4-6 weeks for the pregnancy to be absorbed (though most pregnancies will be absorbed by 3 weeks). °Surgical treatment may be needed. A laparoscope may be used to remove the pregnancy tissue. If severe internal bleeding occurs, a cut (incision) may be made in the lower abdomen (laparotomy), and the ectopic   pregnancy is removed. This stops the bleeding. Part of the fallopian tube, or the whole tube, may be removed as well (salpingectomy). After surgery, pregnancy hormone tests may be done to be sure there is no pregnancy tissue left. You may receive a Rho (D) immune globulin shot if you are Rh negative  and the father is Rh positive, or if you do not know the Rh type of the father. This is to prevent problems with any future pregnancy. °SEEK IMMEDIATE MEDICAL CARE IF:  °You have any symptoms of an ectopic pregnancy. This is a medical emergency. °MAKE SURE YOU: °· Understand these instructions. °· Will watch your condition. °· Will get help right away if you are not doing well or get worse. °Document Released: 07/03/2004 Document Revised: 10/10/2013 Document Reviewed: 12/23/2012 °ExitCare® Patient Information ©2015 [employer]. This information is not intended to replace advice given to you by your health care provider. Make sure you discuss any questions you have with your health care provider. ° ° °

## 2015-01-19 ENCOUNTER — Encounter (HOSPITAL_COMMUNITY): Payer: Self-pay | Admitting: *Deleted

## 2015-01-19 ENCOUNTER — Inpatient Hospital Stay (HOSPITAL_COMMUNITY)
Admission: AD | Admit: 2015-01-19 | Discharge: 2015-01-19 | Disposition: A | Discharge status: Home / Self Care | Payer: Medicare Other | Source: Ambulatory Visit | Attending: Obstetrics & Gynecology | Admitting: Obstetrics & Gynecology

## 2015-01-19 DIAGNOSIS — O9989 Other specified diseases and conditions complicating pregnancy, childbirth and the puerperium: Secondary | ICD-10-CM | POA: Diagnosis not present

## 2015-01-19 DIAGNOSIS — R109 Unspecified abdominal pain: Secondary | ICD-10-CM | POA: Diagnosis not present

## 2015-01-19 DIAGNOSIS — O3680X0 Pregnancy with inconclusive fetal viability, not applicable or unspecified: Secondary | ICD-10-CM

## 2015-01-19 DIAGNOSIS — Z3A01 Less than 8 weeks gestation of pregnancy: Secondary | ICD-10-CM | POA: Insufficient documentation

## 2015-01-19 LAB — HCG, QUANTITATIVE, PREGNANCY: HCG, BETA CHAIN, QUANT, S: 3111 m[IU]/mL — AB (ref <5)

## 2015-01-19 NOTE — MAU Note (Signed)
Jaclyn Maclachlan PA called pt and pt did not answer. Message left for pt. Stating provider would call her back later tonight and in am if provider did not reach pt tonight

## 2015-01-19 NOTE — MAU Note (Addendum)
BHCG results still not back. Pt states she must leave. Verified number given to admissions is correct and pt states it is. Aware provider will call her with results and pt agrees with plan. Pamelia Hoit NP aware

## 2015-01-19 NOTE — MAU Note (Signed)
No longer cramping, some pressure, but no pain. No bleeding.

## 2015-01-19 NOTE — MAU Note (Signed)
Pt states she has to leave and go to work. Lab called and states will be . Pt agrees to stay for results
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
## 2015-01-21 ENCOUNTER — Inpatient Hospital Stay (HOSPITAL_COMMUNITY)
Admission: AD | Admit: 2015-01-21 | Discharge: 2015-01-21 | Disposition: A | Discharge status: Home / Self Care | Payer: Medicare Other | Source: Ambulatory Visit | Attending: Obstetrics & Gynecology | Admitting: Obstetrics & Gynecology

## 2015-01-21 DIAGNOSIS — O0281 Inappropriate change in quantitative human chorionic gonadotropin (hCG) in early pregnancy: Secondary | ICD-10-CM | POA: Diagnosis not present

## 2015-01-21 DIAGNOSIS — O3680X0 Pregnancy with inconclusive fetal viability, not applicable or unspecified: Secondary | ICD-10-CM

## 2015-01-21 DIAGNOSIS — O09521 Supervision of elderly multigravida, first trimester: Secondary | ICD-10-CM | POA: Insufficient documentation

## 2015-01-21 DIAGNOSIS — Z3A01 Less than 8 weeks gestation of pregnancy: Secondary | ICD-10-CM | POA: Insufficient documentation

## 2015-01-21 DIAGNOSIS — O9989 Other specified diseases and conditions complicating pregnancy, childbirth and the puerperium: Secondary | ICD-10-CM | POA: Insufficient documentation

## 2015-01-21 LAB — HCG, QUANTITATIVE, PREGNANCY: HCG, BETA CHAIN, QUANT, S: 5771 m[IU]/mL — AB (ref <5)

## 2015-01-21 NOTE — MAU Note (Signed)
Pt here for follow up HCG levels. Denies cramping or bleeding.

## 2015-01-21 NOTE — MAU Provider Note (Signed)
Subjective:   Pt is a 39 y.o. [id number] here for follow-up BHCG.  Upon review of the records patient was first seen on 01/17/15 for LLQ and nausea and vomiting.   BHCG on that day was 1272.  Ultrasound showed tiny sac in the uterus, felt to represent a small gestational sac.  Based on gestational sac size, gestational age was approximately 5 weeks. Fetal pole not seen.  GC/CT and wet prep were collected.  Results were neg GC/CT and few clue cells.   Pt discharged home.   Pt returned on 01/20/15 for f/u and resolved abdominal pain.  BHCG on that date was 3111.  Pt here today with no report of abdominal pain or vaginal bleeding.     Objective: Physical Exam  Filed Vitals:   01/21/15 2002  BP: 121/71  Pulse: 81  Temp: 98.6 F (37 C)  Resp: 18   Constitutional: She is oriented to person, place, and time. She appears well-developed and well-nourished. No distress.  Neck: Normal range of motion.  Pulmonary/Chest: Effort normal. No respiratory distress.  Musculoskeletal: Normal range of motion.  Neurological: She is alert and oriented to person, place, and time.  Skin: Skin is warm and dry.   Results for orders placed or performed during the hospital encounter of 01/21/15 (from the past 24 hour(s))  hCG, quantitative, pregnancy     Status: Abnormal   Collection Time: 01/21/15  8:10 PM  Result Value Ref Range   hCG, Beta Chain, Quant, S 5771 (H) <5 mIU/mL     Assessment: 39 y.o. [id number] at [redacted]w[redacted]d wks Pregnancy Follow-up BHCG - Normal Rise  Plan: Begin prenatal care Ectopic precautions  Marlis Edelson, CNM

## 2015-01-27 ENCOUNTER — Inpatient Hospital Stay (HOSPITAL_COMMUNITY): Payer: Medicare Other

## 2015-01-27 ENCOUNTER — Inpatient Hospital Stay (HOSPITAL_COMMUNITY)
Admission: AD | Admit: 2015-01-27 | Discharge: 2015-01-27 | Disposition: A | Discharge status: Home / Self Care | Payer: Medicare Other | Source: Ambulatory Visit | Attending: Obstetrics & Gynecology | Admitting: Obstetrics & Gynecology

## 2015-01-27 ENCOUNTER — Encounter (HOSPITAL_COMMUNITY): Payer: Self-pay | Admitting: *Deleted

## 2015-01-27 DIAGNOSIS — O26899 Other specified pregnancy related conditions, unspecified trimester: Secondary | ICD-10-CM

## 2015-01-27 DIAGNOSIS — Z86718 Personal history of other venous thrombosis and embolism: Secondary | ICD-10-CM | POA: Diagnosis not present

## 2015-01-27 DIAGNOSIS — I82511 Chronic embolism and thrombosis of right femoral vein: Secondary | ICD-10-CM

## 2015-01-27 DIAGNOSIS — Z87891 Personal history of nicotine dependence: Secondary | ICD-10-CM | POA: Diagnosis not present

## 2015-01-27 DIAGNOSIS — O209 Hemorrhage in early pregnancy, unspecified: Secondary | ICD-10-CM | POA: Insufficient documentation

## 2015-01-27 DIAGNOSIS — O4691 Antepartum hemorrhage, unspecified, first trimester: Secondary | ICD-10-CM

## 2015-01-27 DIAGNOSIS — R109 Unspecified abdominal pain: Secondary | ICD-10-CM

## 2015-01-27 DIAGNOSIS — Z3A01 Less than 8 weeks gestation of pregnancy: Secondary | ICD-10-CM | POA: Insufficient documentation

## 2015-01-27 LAB — URINALYSIS, ROUTINE W REFLEX MICROSCOPIC
Bilirubin Urine: NEGATIVE
Glucose, UA: NEGATIVE mg/dL
Hgb urine dipstick: NEGATIVE
Ketones, ur: NEGATIVE mg/dL
Leukocytes, UA: NEGATIVE
Nitrite: NEGATIVE
Protein, ur: NEGATIVE mg/dL
Specific Gravity, Urine: 1.02 (ref 1.005–1.030)
Urobilinogen, UA: 0.2 mg/dL (ref 0.0–1.0)
pH: 6 (ref 5.0–8.0)

## 2015-01-27 MED ORDER — ENOXAPARIN SODIUM 40 MG/0.4ML ~~LOC~~ SOLN: 40.0000 mg | SUBCUTANEOUS | Status: DC
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
## 2015-01-27 NOTE — MAU Note (Signed)
Pt reports she started spotting 3 days ago started having cramping today.

## 2015-01-31 ENCOUNTER — Ambulatory Visit (HOSPITAL_COMMUNITY): Payer: Medicare Other

## 2015-02-27 LAB — OB RESULTS CONSOLE VARICELLA ZOSTER ANTIBODY, IGG: VARICELLA IGG: IMMUNE

## 2015-02-27 LAB — OB RESULTS CONSOLE HEPATITIS B SURFACE ANTIGEN: HEP B S AG: NEGATIVE

## 2015-02-27 LAB — OB RESULTS CONSOLE RUBELLA ANTIBODY, IGM: Rubella: IMMUNE

## 2015-06-10 NOTE — L&D Delivery Note (Signed)
Obstetrical Delivery Note   Date of Delivery:   09/06/2015 Primary OB:   [facility] Gestational Age/EDD: 4755w1d (Dated by LMP) Antepartum complications: DVT (on Lovenox intermittently), PTSD/ anxiety, AMA  Delivered By:   Farrel ConnersGUTIERREZ, Rebecah Dangerfield, CNM  Delivery Type:   spontaneous vaginal delivery  Procedure Details:   Called into room when there was SROM for particulate meconium. Cervix had dilated from 2.5 to 8-9 in one hour. Patient rapidly progressed to C/C/+1 and pushed well delivering a viable female infant with nuchal cord x3. Tight cord was clamped and ligated, then unraveled prior to delivering the baby's body. Apgars 4/9. Baby was not vigorous at birth and was taken to warmed,  was suctioned by neonatal team then bagged. Baby responded well and began crying. Placenta and three vessel cord delivered intact. Perineum, vagina, cervix were without lacerations  Anesthesia:    none Intrapartum complications: Thick meconium stained fluid, tight nuchal cord x 3, precipitous labor GBS:    negative Laceration:    none Episiotomy:    none Placenta:    Via active 3rd stage. To pathology: no Estimated Blood Loss:  * No surgery found *  Baby:    Liveborn female, Apgars 4/9, weight 6#8.4 oz    Burtis Imhoff, CNM

## 2015-06-24 LAB — OB RESULTS CONSOLE RPR: RPR: NONREACTIVE

## 2015-06-27 ENCOUNTER — Encounter (HOSPITAL_COMMUNITY): Payer: Self-pay | Admitting: *Deleted

## 2015-06-27 ENCOUNTER — Inpatient Hospital Stay (HOSPITAL_COMMUNITY)
Admission: AD | Admit: 2015-06-27 | Discharge: 2015-06-28 | Disposition: A | Discharge status: Home / Self Care | Payer: Medicare Other | Source: Ambulatory Visit | Attending: Obstetrics and Gynecology | Admitting: Obstetrics and Gynecology

## 2015-06-27 DIAGNOSIS — Z3A28 28 weeks gestation of pregnancy: Secondary | ICD-10-CM | POA: Diagnosis not present

## 2015-06-27 DIAGNOSIS — O479 False labor, unspecified: Secondary | ICD-10-CM

## 2015-06-27 DIAGNOSIS — O4703 False labor before 37 completed weeks of gestation, third trimester: Secondary | ICD-10-CM | POA: Diagnosis not present

## 2015-06-27 DIAGNOSIS — Z87891 Personal history of nicotine dependence: Secondary | ICD-10-CM | POA: Insufficient documentation

## 2015-06-27 DIAGNOSIS — Z3492 Encounter for supervision of normal pregnancy, unspecified, second trimester: Secondary | ICD-10-CM | POA: Insufficient documentation

## 2015-06-27 DIAGNOSIS — Z86718 Personal history of other venous thrombosis and embolism: Secondary | ICD-10-CM | POA: Diagnosis not present

## 2015-06-27 NOTE — MAU Note (Signed)
PT  SAYS SHE STARTED HAVING  UC  AT 12NOON.     ALL HAS  BEEN OK BEFORE  TODAY.    WAS SEEN HERE IN FIRST    PART OF PREG  FOR  SPOTTING.      GETS PNC   AT  [facility]   BC  OF  AGE  AND HX OF DVT'S.

## 2015-06-27 NOTE — MAU Note (Signed)
Pt reports contractions since noon today, worsening and now having pressure in her lower abd.

## 2015-06-28 DIAGNOSIS — Z3A28 28 weeks gestation of pregnancy: Secondary | ICD-10-CM

## 2015-06-28 DIAGNOSIS — O4703 False labor before 37 completed weeks of gestation, third trimester: Secondary | ICD-10-CM

## 2015-06-28 DIAGNOSIS — Z3492 Encounter for supervision of normal pregnancy, unspecified, second trimester: Secondary | ICD-10-CM | POA: Diagnosis not present

## 2015-06-28 LAB — URINE MICROSCOPIC-ADD ON

## 2015-06-28 LAB — URINALYSIS, ROUTINE W REFLEX MICROSCOPIC
Bilirubin Urine: NEGATIVE
Glucose, UA: NEGATIVE mg/dL
Ketones, ur: NEGATIVE mg/dL
Leukocytes, UA: NEGATIVE
Nitrite: NEGATIVE
PH: 5 (ref 5.0–8.0)
Protein, ur: NEGATIVE mg/dL

## 2015-06-28 NOTE — MAU Provider Note (Signed)
History     CSN: [id number]  Arrival date and time: 06/27/15 2314   First Provider Initiated Contact with Patient 06/28/15 0013      No chief complaint on file.  HPI  Jaclyn Dougherty is a 40 year old G10P5 at [redacted]w[redacted]d presenting with contractions. Patient receives [facility]. Pressure and tightness started at 12pm. States they were every 4-6 minutes at home, but have become less frequent since she has been in MAU. Notes that she had some leakage of fluid that had yellow tinge 1/18 morning around 10:45AM but has not had any LOF since then. Also notes of mucus-like discharge yesterday AM. No vaginal bleeding. Endorses fetal movement.   Of note, per chart review in care everywhere patient was admitted from 1/14 to 1/16 for preterm contractions; she was given Magnesium initially. She was seen again on 1/18 for contractions. There, ferning and nitrizine were negative. And cervix was unchanged per note.   Patient has been taking Tylenol and staying hydrated which have not helped improve symptoms. She has also tried maternity bad which helps with the pressure but does not help with contractions.   Past Medical History  Diagnosis Date  . DVT (deep venous thrombosis) (HCC) 01/2014  . Anemia   . Clotting disorder (HCC)   . UTI (lower urinary tract infection)   . Mania [facility])     Past Surgical History  Procedure Laterality Date  . Tonsillectomy    . Breast reconstruction Bilateral 2000    breast augumentation  . Dilation and curettage of uterus  July, 2015     miscarriage @ 10 weeks     History reviewed. No pertinent family history.  Social History  Substance Use Topics  . Smoking status: Former Smoker    Types: Cigarettes    Quit date: 01/26/2014  . Smokeless tobacco: None     Comment: smokes electronic cigarettes  . Alcohol Use: No     Comment: social     Allergies: No Known Allergies  Prescriptions prior to admission  Medication Sig Dispense Refill Last Dose  . clonazePAM  (KLONOPIN) 1 MG tablet Take 3 mg by mouth 3 (three) times daily as needed for anxiety.   06/27/2015 at Unknown time  . Multiple Vitamin (MULTIVITAMIN WITH MINERALS) TABS tablet Take 1 tablet by mouth daily.   Past Week at Unknown time  . traZODone (DESYREL) 50 MG tablet Take 50 mg by mouth at bedtime.    06/26/2015 at Unknown time  . enoxaparin (LOVENOX) 40 MG/0.4ML injection Inject 0.4 mLs (40 mg total) into the skin daily. 7 Syringe 0   . prazosin (MINIPRESS) 5 MG capsule Take 5 mg by mouth at bedtime.    More than a month at Unknown time    ROS Physical Exam   Blood pressure 115/56, pulse 71, temperature 97.8 F (36.6 C), temperature source Oral, resp. rate 20, height 5' 5.5" (1.664 m), weight 73.483 kg (162 lb), last menstrual period 12/13/2014, SpO2 100 %.  Physical Exam GEN: NAD CV: RRR, no murmurs, rubs, or gallops PULM: CTAB, normal effort ABD: Soft, nontender, nondistended, NABS, no organomegaly SKIN: No rash or cyanosis; warm and well-perfused EXTR: No lower extremity edema or calf tenderness  Cervical check: closed and thick  FHR:  130 baseline, + accelerations, no decelerations.   MAU Course  Procedures   Assessment and Plan  Symptoms likely due to braxton hicks as cervical check is closed and thick. No regular contractions seen on tocometer. FHT is reassuring  as well.  Return precautions provided.   Palma Holter 06/28/2015, 12:49 AM   CNM attestation:  I have seen and examined this patient; I agree with above documentation in the resident's note.   Jaclyn Dougherty is a 40 y.o. [id number] reporting abd tightening +FM, denies LOF, VB,  vaginal discharge.  PE: BP 104/59 mmHg  Pulse 81  Temp(Src) 97.8 F (36.6 C) (Oral)  Resp 20  Ht 5' 5.5" (1.664 m)  Wt 73.483 kg (162 lb)  BMI 26.54 kg/m2  SpO2 100%  LMP 12/13/2014 Gen: calm comfortable, NAD Resp: normal effort, no distress Abd: gravid  ROS, labs, PMH reviewed NST reactive and without  ctx  Plan: - preterm labor precautions - continue routine follow up in [facility] clinic  Cam Hai, CNM 5:57 AM  06/28/2015

## 2015-06-28 NOTE — Discharge Instructions (Signed)
Your symptoms are likely due to braxton hicks contractions. your contractions increase in frequency or intensity, if you have a gush of clear fluid, if you have vaginal bleeding like a period, or if you have decreased fetal movement, please seek medical care.   Braxton Hicks Contractions Contractions of the uterus can occur throughout pregnancy. Contractions are not always a sign that you are in labor.  WHAT ARE BRAXTON HICKS CONTRACTIONS?  Contractions that occur before labor are called Braxton Hicks contractions, or false labor. Toward the end of pregnancy (32-34 weeks), these contractions can develop more often and may become more forceful. This is not true labor because these contractions do not result in opening (dilatation) and thinning of the cervix. They are sometimes difficult to tell apart from true labor because these contractions can be forceful and people have different pain tolerances. You should not feel embarrassed if you go to the hospital with false labor. Sometimes, the only way to tell if you are in true labor is for your health care provider to look for changes in the cervix. If there are no prenatal problems or other health problems associated with the pregnancy, it is completely safe to be sent home with false labor and await the onset of true labor. HOW CAN YOU TELL THE DIFFERENCE BETWEEN TRUE AND FALSE LABOR? False Labor  The contractions of false labor are usually shorter and not as hard as those of true labor.   The contractions are usually irregular.   The contractions are often felt in the front of the lower abdomen and in the groin.   The contractions may go away when you walk around or change positions while lying down.   The contractions get weaker and are shorter lasting as time goes on.   The contractions do not usually become progressively stronger, regular, and closer together as with true labor.  True Labor  Contractions in true labor last 30-70  seconds, become very regular, usually become more intense, and increase in frequency.   The contractions do not go away with walking.   The discomfort is usually felt in the top of the uterus and spreads to the lower abdomen and low back.   True labor can be determined by your health care provider with an exam. This will show that the cervix is dilating and getting thinner.  WHAT TO REMEMBER  Keep up with your usual exercises and follow other instructions given by your health care provider.   Take medicines as directed by your health care provider.   Keep your regular prenatal appointments.   Eat and drink lightly if you think you are going into labor.   If Braxton Hicks contractions are making you uncomfortable:   Change your position from lying down or resting to walking, or from walking to resting.   Sit and rest in a tub of warm water.   Drink 2-3 glasses of water. Dehydration may cause these contractions.   Do slow and deep breathing several times an hour.  WHEN SHOULD I SEEK IMMEDIATE MEDICAL CARE? Seek immediate medical care if:  Your contractions become stronger, more regular, and closer together.   You have fluid leaking or gushing from your vagina.   You have a fever.   You pass blood-tinged mucus.   You have vaginal bleeding.   You have continuous abdominal pain.   You have low back pain that you never had before.   You feel your baby's head pushing down and causing pelvic  pressure.   Your baby is not moving as much as it used to.    This information is not intended to replace advice given to you by your health care provider. Make sure you discuss any questions you have with your health care provider.   Document Released: 05/26/2005 Document Revised: 05/31/2013 Document Reviewed: 03/07/2013 Elsevier Interactive Patient Education [employer]

## 2015-07-05 ENCOUNTER — Observation Stay
Admission: EM | Admit: 2015-07-05 | Discharge: 2015-07-06 | Disposition: A | Discharge status: Home / Self Care | Payer: Medicare Other | Attending: Obstetrics & Gynecology | Admitting: Obstetrics & Gynecology

## 2015-07-05 DIAGNOSIS — Z7901 Long term (current) use of anticoagulants: Secondary | ICD-10-CM | POA: Diagnosis not present

## 2015-07-05 DIAGNOSIS — O09213 Supervision of pregnancy with history of pre-term labor, third trimester: Secondary | ICD-10-CM | POA: Insufficient documentation

## 2015-07-05 DIAGNOSIS — O479 False labor, unspecified: Secondary | ICD-10-CM | POA: Diagnosis present

## 2015-07-05 DIAGNOSIS — Z86718 Personal history of other venous thrombosis and embolism: Secondary | ICD-10-CM | POA: Diagnosis not present

## 2015-07-05 DIAGNOSIS — Z3A29 29 weeks gestation of pregnancy: Secondary | ICD-10-CM | POA: Insufficient documentation

## 2015-07-05 DIAGNOSIS — O09523 Supervision of elderly multigravida, third trimester: Secondary | ICD-10-CM | POA: Diagnosis not present

## 2015-07-05 LAB — URINALYSIS COMPLETE WITH MICROSCOPIC (ARMC ONLY)
BILIRUBIN URINE: NEGATIVE
Bacteria, UA: NONE SEEN
Glucose, UA: NEGATIVE mg/dL
Hgb urine dipstick: NEGATIVE
KETONES UR: NEGATIVE mg/dL
LEUKOCYTES UA: NEGATIVE
Nitrite: NEGATIVE
PROTEIN: NEGATIVE mg/dL
RBC / HPF: NONE SEEN RBC/hpf (ref 0–5)
SPECIFIC GRAVITY, URINE: 1.002 — AB (ref 1.005–1.030)
Squamous Epithelial / LPF: NONE SEEN
pH: 6 (ref 5.0–8.0)

## 2015-07-05 LAB — FETAL FIBRONECTIN: Fetal Fibronectin: NEGATIVE

## 2015-07-05 MED ORDER — BUTORPHANOL TARTRATE 1 MG/ML IJ SOLN
2.0000 mg | INTRAMUSCULAR | Status: DC | PRN
  Administered 2015-07-05: 2 mg via INTRAVENOUS
  Filled 2015-07-05: qty 2

## 2015-07-05 MED ORDER — ONDANSETRON HCL 4 MG/2ML IJ SOLN: 4.0000 mg | Freq: Four times a day (QID) | INTRAMUSCULAR | Status: DC | PRN

## 2015-07-05 MED ORDER — LACTATED RINGERS IV SOLN
INTRAVENOUS | Status: DC
  Administered 2015-07-05: 22:00:00 via INTRAVENOUS

## 2015-07-05 MED ORDER — NIFEDIPINE ER 30 MG PO TB24
30.0000 mg | ORAL_TABLET | Freq: Every day | ORAL | Status: DC
Start: 2015-07-06 — End: 2015-07-06
  Filled 2015-07-05: qty 1

## 2015-07-05 MED ORDER — LACTATED RINGERS IV SOLN
INTRAVENOUS | Status: DC
  Administered 2015-07-05: 23:00:00 via INTRAVENOUS

## 2015-07-05 MED ORDER — ACETAMINOPHEN 325 MG PO TABS: 650.0000 mg | ORAL_TABLET | ORAL | Status: DC | PRN

## 2015-07-05 NOTE — Plan of Care (Signed)
Report to Dr. Tiburcio Pea with lab results and contraction frequency. Per MD, pt may continue to rest with stadol. If contractions remain stable after 2 hours, will discuss discharge.

## 2015-07-05 NOTE — H&P (Signed)
Obstetrics Admission History & Physical   Abdominal Cramping   HPI:  40 y.o. [id number] @ [redacted]w[redacted]d (09/19/2015, by Last Menstrual Period). Admitted on 07/05/2015:   Patient Active Problem List   Diagnosis Date Noted  . Irregular contractions 07/05/2015  . DVT (deep venous thrombosis) (HCC) 02/03/2014     Presents for ctxs today, no VB or LOF.  Was seen 2 weeks ago at [facility] with PTL treated w Magnesium/fluids/steroids, later released.  Prior pregnancies all had PTL at varying times, often treated w terb or fluids, deliveries between 35-37 weeks.  On Lovenox for prior DVT.   Prenatal care at: at another place - [facility] in [place].    PMHx:  Past Medical History  Diagnosis Date  . DVT (deep venous thrombosis) (HCC) 01/2014  . Anemia   . Clotting disorder (HCC)   . UTI (lower urinary tract infection)   . Mania [facility])    PSHx:  Past Surgical History  Procedure Laterality Date  . Tonsillectomy    . Breast reconstruction Bilateral 2000    breast augumentation  . Dilation and curettage of uterus  July, 2015     miscarriage @ 10 weeks    Medications:  Prescriptions prior to admission  Medication Sig Dispense Refill Last Dose  . clonazePAM (KLONOPIN) 1 MG tablet Take 3 mg by mouth 3 (three) times daily as needed for anxiety.   07/04/2015  . enoxaparin (LOVENOX) 40 MG/0.4ML injection Inject 0.4 mLs (40 mg total) into the skin daily. 7 Syringe 0 07/04/2015  . Multiple Vitamin (MULTIVITAMIN WITH MINERALS) TABS tablet Take 1 tablet by mouth daily.   07/04/2015  . prazosin (MINIPRESS) 5 MG capsule Take 5 mg by mouth at bedtime.    07/04/2015  . traZODone (DESYREL) 50 MG tablet Take 50 mg by mouth at bedtime.    07/04/2015   Allergies: has No Known Allergies. OBHx:  OB History  Gravida Para Term Preterm AB SAB TAB Ectopic Multiple Living  0 0 0 5    # Outcome Date GA Lbr Len/2nd Weight Sex Delivery Anes PTL Lv  10 Current           9 SAB           8 Gravida      Vag-Spont     7  Gravida      Vag-Spont     6 TAB           5 Term      Vag-Spont     4 TAB           3 SAB      Vag-Spont     2 Term      Vag-Spont     1 SAB              VWU:[id number] except as detailed in HPI. Soc Hx: Pregnancy welcomed  Objective:   Filed Vitals:   07/05/15 2052  BP: 108/57  Pulse: 78  Temp: 98.7 F (37.1 C)  Resp: 20   General: Well nourished, well developed female in no acute distress.  Skin: Warm and dry.  Cardiovascular:Regular rate and rhythm. Respiratory: Clear to auscultation bilateral. Normal respiratory effort Abdomen: moderate Neuro/Psych: Normal mood and affect.   Pelvic exam: is not limited by body habitus EGBUS: within normal limits Vagina: within normal limits and with normal mucosa blood in the vault Cervix: Closed and 40% (no dilation), high presentation with uncertainty as to Vtx Uterus:  Spontaneous uterine activity  Adnexa: not evaluated  EFM:FHR: 150 bpm, variability: moderate,  accelerations:  Present,  decelerations:  Absent Toco: Frequency: Every 5-8 minutes   Perinatal info:  Blood type: A positive Rubella- Unknown Varicella -Unknown TDaP tetanus status unknown to the patient RPR NR / HIV Neg/ HBsAg Neg   Assessment & Plan:   40 y.o. [id number] @ [redacted]w[redacted]d, Admitted on 07/05/2015:  Exam not c/w PTL.  She is having ctxs.  Abd pain.   Will check UA, fFN.   Analgesia. Observe for cervical change and Fetal Wellbeing Reassuring Consider Procardia for uterine contractility

## 2015-07-06 NOTE — Discharge Instructions (Signed)

## 2015-07-07 ENCOUNTER — Observation Stay
Admission: EM | Admit: 2015-07-07 | Discharge: 2015-07-08 | Disposition: A | Discharge status: Home / Self Care | Payer: Medicare Other | Attending: Obstetrics and Gynecology | Admitting: Obstetrics and Gynecology

## 2015-07-07 DIAGNOSIS — Z3A29 29 weeks gestation of pregnancy: Secondary | ICD-10-CM | POA: Insufficient documentation

## 2015-07-07 DIAGNOSIS — O09213 Supervision of pregnancy with history of pre-term labor, third trimester: Secondary | ICD-10-CM | POA: Insufficient documentation

## 2015-07-07 DIAGNOSIS — Z7901 Long term (current) use of anticoagulants: Secondary | ICD-10-CM | POA: Insufficient documentation

## 2015-07-07 DIAGNOSIS — O09523 Supervision of elderly multigravida, third trimester: Secondary | ICD-10-CM | POA: Insufficient documentation

## 2015-07-07 DIAGNOSIS — Z86718 Personal history of other venous thrombosis and embolism: Secondary | ICD-10-CM | POA: Insufficient documentation

## 2015-07-07 DIAGNOSIS — Z91410 Personal history of adult physical and sexual abuse: Secondary | ICD-10-CM | POA: Insufficient documentation
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
## 2015-07-08 DIAGNOSIS — Z7901 Long term (current) use of anticoagulants: Secondary | ICD-10-CM | POA: Diagnosis not present

## 2015-07-08 DIAGNOSIS — O09523 Supervision of elderly multigravida, third trimester: Secondary | ICD-10-CM | POA: Diagnosis not present

## 2015-07-08 DIAGNOSIS — O09213 Supervision of pregnancy with history of pre-term labor, third trimester: Secondary | ICD-10-CM | POA: Diagnosis not present

## 2015-07-08 DIAGNOSIS — Z91410 Personal history of adult physical and sexual abuse: Secondary | ICD-10-CM | POA: Diagnosis not present

## 2015-07-08 DIAGNOSIS — Z3A29 29 weeks gestation of pregnancy: Secondary | ICD-10-CM | POA: Diagnosis not present

## 2015-07-08 DIAGNOSIS — Z86718 Personal history of other venous thrombosis and embolism: Secondary | ICD-10-CM | POA: Diagnosis not present

## 2015-07-08 LAB — URINE DRUG SCREEN, QUALITATIVE (ARMC ONLY)
AMPHETAMINES, UR SCREEN: NOT DETECTED
BENZODIAZEPINE, UR SCRN: POSITIVE — AB
Barbiturates, Ur Screen: NOT DETECTED
COCAINE METABOLITE, UR ~~LOC~~: NOT DETECTED
Cannabinoid 50 Ng, Ur ~~LOC~~: NOT DETECTED
MDMA (ECSTASY) UR SCREEN: NOT DETECTED
METHADONE SCREEN, URINE: NOT DETECTED
OPIATE, UR SCREEN: NOT DETECTED
PHENCYCLIDINE (PCP) UR S: NOT DETECTED
Tricyclic, Ur Screen: NOT DETECTED
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
## 2015-07-08 NOTE — Final Progress Note (Signed)
Physician Final Progress Note  Patient ID: Magie Ciampa MRN: [id number] DOB/AGE: [birth year] 40 y.o.  The entire visit summary from admit to discharge is detailed in the H&P.  Please refer to this document for full details.  Thomasene Mohair, MD 07/08/2015 2:08 AM

## 2015-07-08 NOTE — Discharge Instructions (Signed)
Please get plenty of rest and fluids. Light lifting/work, keep stress low and use relaxing techniques.

## 2015-07-10 LAB — CULTURE, BETA STREP (GROUP B ONLY)

## 2015-07-30 LAB — OB RESULTS CONSOLE GC/CHLAMYDIA
Chlamydia: NEGATIVE
Gonorrhea: NEGATIVE

## 2015-07-30 LAB — OB RESULTS CONSOLE GBS: GBS: NEGATIVE

## 2015-08-06 ENCOUNTER — Encounter: Payer: Self-pay | Admitting: *Deleted

## 2015-08-06 ENCOUNTER — Observation Stay
Admission: EM | Admit: 2015-08-06 | Discharge: 2015-08-07 | Disposition: A | Discharge status: Home / Self Care | Payer: Medicare Other | Attending: Obstetrics and Gynecology | Admitting: Obstetrics and Gynecology

## 2015-08-06 DIAGNOSIS — Z91410 Personal history of adult physical and sexual abuse: Secondary | ICD-10-CM | POA: Insufficient documentation

## 2015-08-06 DIAGNOSIS — O09523 Supervision of elderly multigravida, third trimester: Secondary | ICD-10-CM | POA: Insufficient documentation

## 2015-08-06 DIAGNOSIS — O4700 False labor before 37 completed weeks of gestation, unspecified trimester: Secondary | ICD-10-CM | POA: Diagnosis present

## 2015-08-06 DIAGNOSIS — Z86718 Personal history of other venous thrombosis and embolism: Secondary | ICD-10-CM | POA: Insufficient documentation

## 2015-08-06 DIAGNOSIS — Z3A33 33 weeks gestation of pregnancy: Secondary | ICD-10-CM | POA: Insufficient documentation

## 2015-08-06 DIAGNOSIS — F17210 Nicotine dependence, cigarettes, uncomplicated: Secondary | ICD-10-CM | POA: Insufficient documentation

## 2015-08-06 DIAGNOSIS — O99333 Smoking (tobacco) complicating pregnancy, third trimester: Secondary | ICD-10-CM | POA: Insufficient documentation

## 2015-08-06 NOTE — OB Triage Note (Signed)
Recvd pt from ED. States she started having contractions 2-3 hours ago and that they are 3-5 minutes apart. Pt states no vaginal bleeding or discharge and no intercourse in last 24 hours.

## 2015-08-07 ENCOUNTER — Encounter: Payer: Self-pay | Admitting: Advanced Practice Midwife

## 2015-08-07 DIAGNOSIS — F17210 Nicotine dependence, cigarettes, uncomplicated: Secondary | ICD-10-CM | POA: Diagnosis not present

## 2015-08-07 DIAGNOSIS — O09523 Supervision of elderly multigravida, third trimester: Secondary | ICD-10-CM | POA: Diagnosis not present

## 2015-08-07 DIAGNOSIS — Z3A33 33 weeks gestation of pregnancy: Secondary | ICD-10-CM | POA: Diagnosis not present

## 2015-08-07 DIAGNOSIS — Z86718 Personal history of other venous thrombosis and embolism: Secondary | ICD-10-CM | POA: Diagnosis not present

## 2015-08-07 DIAGNOSIS — Z91410 Personal history of adult physical and sexual abuse: Secondary | ICD-10-CM | POA: Diagnosis not present

## 2015-08-07 DIAGNOSIS — O99333 Smoking (tobacco) complicating pregnancy, third trimester: Secondary | ICD-10-CM | POA: Diagnosis not present

## 2015-08-07 DIAGNOSIS — O4700 False labor before 37 completed weeks of gestation, unspecified trimester: Secondary | ICD-10-CM | POA: Diagnosis present

## 2015-08-07 MED ORDER — TERBUTALINE SULFATE 1 MG/ML IJ SOLN
0.2500 mg | Freq: Once | INTRAMUSCULAR | Status: AC | PRN
  Administered 2015-08-07: 0.25 mg via SUBCUTANEOUS
  Filled 2015-08-07: qty 1

## 2015-08-07 MED ORDER — BUTORPHANOL TARTRATE 1 MG/ML IJ SOLN
1.0000 mg | Freq: Once | INTRAMUSCULAR | Status: AC
  Administered 2015-08-07: 1 mg via INTRAVENOUS
  Filled 2015-08-07: qty 1

## 2015-08-07 MED ORDER — BUTORPHANOL TARTRATE 1 MG/ML IJ SOLN: 1.0000 mg | INTRAMUSCULAR | Status: DC | PRN

## 2015-08-07 MED ORDER — LACTATED RINGERS IV SOLN
INTRAVENOUS | Status: DC
  Administered 2015-08-07: 01:00:00 via INTRAVENOUS
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
## 2015-08-07 NOTE — Discharge Summary (Signed)
S: Ctx resolved after IV stadol and terbutaline.   O: Cat 1 tracing, ctx resolved, VSS  A: IUP at [redacted]w[redacted]d with preterm ctx, not in PTL  P: Discharge home. F/u at [facility] per next scheduled visit. Pt considering transfer to [facility].

## 2015-08-11 ENCOUNTER — Observation Stay
Admission: EM | Admit: 2015-08-11 | Discharge: 2015-08-12 | Payer: Medicare Other | Attending: Certified Nurse Midwife | Admitting: Certified Nurse Midwife

## 2015-08-11 DIAGNOSIS — Z3A34 34 weeks gestation of pregnancy: Secondary | ICD-10-CM | POA: Diagnosis not present

## 2015-08-11 DIAGNOSIS — M79604 Pain in right leg: Secondary | ICD-10-CM

## 2015-08-11 DIAGNOSIS — O99343 Other mental disorders complicating pregnancy, third trimester: Secondary | ICD-10-CM | POA: Insufficient documentation

## 2015-08-11 DIAGNOSIS — F419 Anxiety disorder, unspecified: Secondary | ICD-10-CM | POA: Insufficient documentation

## 2015-08-11 DIAGNOSIS — F17210 Nicotine dependence, cigarettes, uncomplicated: Secondary | ICD-10-CM | POA: Insufficient documentation

## 2015-08-11 DIAGNOSIS — O26893 Other specified pregnancy related conditions, third trimester: Secondary | ICD-10-CM | POA: Diagnosis not present

## 2015-08-11 DIAGNOSIS — F4310 Post-traumatic stress disorder, unspecified: Secondary | ICD-10-CM | POA: Insufficient documentation

## 2015-08-11 DIAGNOSIS — O4700 False labor before 37 completed weeks of gestation, unspecified trimester: Secondary | ICD-10-CM | POA: Diagnosis present

## 2015-08-11 DIAGNOSIS — R197 Diarrhea, unspecified: Secondary | ICD-10-CM | POA: Diagnosis not present

## 2015-08-11 DIAGNOSIS — O2223 Superficial thrombophlebitis in pregnancy, third trimester: Secondary | ICD-10-CM | POA: Diagnosis not present

## 2015-08-11 DIAGNOSIS — R112 Nausea with vomiting, unspecified: Secondary | ICD-10-CM | POA: Insufficient documentation

## 2015-08-11 DIAGNOSIS — F329 Major depressive disorder, single episode, unspecified: Secondary | ICD-10-CM | POA: Insufficient documentation

## 2015-08-11 DIAGNOSIS — O09523 Supervision of elderly multigravida, third trimester: Secondary | ICD-10-CM | POA: Insufficient documentation

## 2015-08-11 DIAGNOSIS — O99333 Smoking (tobacco) complicating pregnancy, third trimester: Secondary | ICD-10-CM | POA: Diagnosis not present

## 2015-08-11 DIAGNOSIS — I82413 Acute embolism and thrombosis of femoral vein, bilateral: Secondary | ICD-10-CM | POA: Insufficient documentation

## 2015-08-11 DIAGNOSIS — Z86718 Personal history of other venous thrombosis and embolism: Secondary | ICD-10-CM | POA: Diagnosis not present

## 2015-08-11 MED ORDER — LACTATED RINGERS IV BOLUS (SEPSIS)
500.0000 mL | Freq: Once | INTRAVENOUS | Status: AC
  Administered 2015-08-12: 500 mL via INTRAVENOUS

## 2015-08-11 MED ORDER — TERBUTALINE SULFATE 1 MG/ML IJ SOLN
0.2500 mg | Freq: Once | INTRAMUSCULAR | Status: AC
  Administered 2015-08-11: 0.25 mg via SUBCUTANEOUS
  Filled 2015-08-11: qty 1

## 2015-08-11 MED ORDER — LACTATED RINGERS IV SOLN
INTRAVENOUS | Status: DC
  Administered 2015-08-12: 03:00:00 via INTRAVENOUS

## 2015-08-11 MED ORDER — ONDANSETRON HCL 4 MG/2ML IJ SOLN
4.0000 mg | INTRAMUSCULAR | Status: DC | PRN
  Administered 2015-08-12: 4 mg via INTRAVENOUS
  Filled 2015-08-11: qty 2

## 2015-08-11 NOTE — OB Triage Note (Signed)
Pt. reports passing her mucous plug three days prior; reports contractions beginning approximately three hours ago and intensifying since. Denies LOF, VB. States baby is moving but movement has decreased in the hour prior to arrival.

## 2015-08-12 ENCOUNTER — Observation Stay: Payer: Medicare Other

## 2015-08-12 LAB — CBC
HCT: 33.9 % — ABNORMAL LOW (ref 35.0–47.0)
Hemoglobin: 11.7 g/dL — ABNORMAL LOW (ref 12.0–16.0)
MCH: 33.2 pg (ref 26.0–34.0)
MCHC: 34.7 g/dL (ref 32.0–36.0)
MCV: 95.8 fL (ref 80.0–100.0)
PLATELETS: 165 10*3/uL (ref 150–440)
RBC: 3.54 MIL/uL — ABNORMAL LOW (ref 3.80–5.20)
RDW: 13.2 % (ref 11.5–14.5)
WBC: 6.8 10*3/uL (ref 3.6–11.0)

## 2015-08-12 LAB — URINALYSIS COMPLETE WITH MICROSCOPIC (ARMC ONLY)
Bacteria, UA: NONE SEEN
Bilirubin Urine: NEGATIVE
Glucose, UA: NEGATIVE mg/dL
Hgb urine dipstick: NEGATIVE
KETONES UR: NEGATIVE mg/dL
Leukocytes, UA: NEGATIVE
Nitrite: NEGATIVE
PROTEIN: NEGATIVE mg/dL
Specific Gravity, Urine: 1.008 (ref 1.005–1.030)
pH: 7 (ref 5.0–8.0)

## 2015-08-12 LAB — COMPREHENSIVE METABOLIC PANEL
ALK PHOS: 129 U/L — AB (ref 38–126)
ALT: 11 U/L — AB (ref 14–54)
ANION GAP: 8 (ref 5–15)
AST: 22 U/L (ref 15–41)
Albumin: 3.2 g/dL — ABNORMAL LOW (ref 3.5–5.0)
BILIRUBIN TOTAL: 0.5 mg/dL (ref 0.3–1.2)
BUN: 8 mg/dL (ref 6–20)
CALCIUM: 9.3 mg/dL (ref 8.9–10.3)
CO2: 21 mmol/L — ABNORMAL LOW (ref 22–32)
CREATININE: 0.5 mg/dL (ref 0.44–1.00)
Chloride: 107 mmol/L (ref 101–111)
GFR calc Af Amer: >60 mL/min (ref >60)
GFR calc non Af Amer: >60 mL/min (ref >60)
GLUCOSE: 101 mg/dL — AB (ref 65–99)
Potassium: 3.4 mmol/L — ABNORMAL LOW (ref 3.5–5.1)
Sodium: 136 mmol/L (ref 135–145)
TOTAL PROTEIN: 6.6 g/dL (ref 6.5–8.1)

## 2015-08-12 LAB — TYPE AND SCREEN
ABO/RH(D): A POS
Antibody Screen: NEGATIVE

## 2015-08-12 LAB — ABO/RH: ABO/RH(D): A POS

## 2015-08-12 MED ORDER — BUTORPHANOL TARTRATE 1 MG/ML IJ SOLN
1.0000 mg | Freq: Once | INTRAMUSCULAR | Status: AC
  Administered 2015-08-12: 1 mg via INTRAVENOUS
  Filled 2015-08-12: qty 1

## 2015-08-12 MED ORDER — TRAZODONE HCL 50 MG PO TABS
50.0000 mg | ORAL_TABLET | Freq: Every day | ORAL | Status: DC
  Administered 2015-08-12: 50 mg via ORAL
  Filled 2015-08-12: qty 1

## 2015-08-12 MED ORDER — ONDANSETRON 4 MG PO TBDP
ORAL_TABLET | ORAL | Status: AC
  Administered 2015-08-12: 8 mg via ORAL
  Filled 2015-08-12: qty 2

## 2015-08-12 MED ORDER — TERBUTALINE SULFATE 1 MG/ML IJ SOLN
0.2500 mg | Freq: Once | INTRAMUSCULAR | Status: AC
  Administered 2015-08-12: 0.25 mg via SUBCUTANEOUS
  Filled 2015-08-12: qty 1

## 2015-08-12 MED ORDER — CLONAZEPAM 0.5 MG PO TABS
1.0000 mg | ORAL_TABLET | Freq: Two times a day (BID) | ORAL | Status: DC
  Administered 2015-08-12: 1 mg via ORAL
  Filled 2015-08-12: qty 2

## 2015-08-12 MED ORDER — ONDANSETRON 4 MG PO TBDP
8.0000 mg | ORAL_TABLET | Freq: Once | ORAL | Status: AC
  Administered 2015-08-12: 8 mg via ORAL

## 2015-08-12 NOTE — Discharge Summary (Signed)
S: Pt recently woke up after resting. Denies regular ctx currently   O: Ctx: q 20 min, cat 1 tracing, baseline 130s, + accels, no decels  A: IUP at 6057w4d, preterm contractions, not in labor  P: Will watch for 1 more hour after waking. Zofran and regular diet for breakfast.  Pt to f/u with [facility] (or [facility] if she is able to transfer care in time) this week.   Discussed preterm ctx vs preterm labor, preterm labor management, IOL protocols and hospital protocols for epidurals, etc as pt is considering transfer of care. Discussed that she will need co-management with [facility] d/t high risk nature of her pregnancy.    Addendum:   Pt c/o leg pain behind right knee c/w prior DVT sx. - will order US of leg.    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 Reviewed with Dr Jean RosenthalJackson who recommended consult with [facility]. I spoke with Dr Kathrine HaddockAmber Wood who [facility] and management of medication. Pt amenable to plan.
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
## 2015-08-12 NOTE — Progress Notes (Signed)
Progress Note  S: "But the contractions aren't stopping. I need something for pain/ pressure."  O: 117/58 General: appears uncomfortable with contractions, holding breath with contractions After 2 doses of terbutaline that were 1 hour apart-contracting q2-7 min apart FHR reactive with baseline 135 and accelerations to 160s, mod variability Toco: contractions q2-7 min apart Cervix: FT-1/ 20-30%/ -3  A: IUP at 34.3 weeks with threatened preterm labor  P: Stadol for pain x1, now Trazadone 50 mgm now Continue to monitor for contractions and cervical change Sips of clear liquids  Bayler Gehrig, CNM

## 2015-08-13 LAB — RPR: RPR: NONREACTIVE

## 2015-08-14 LAB — CULTURE, BETA STREP (GROUP B ONLY)

## 2015-08-17 ENCOUNTER — Emergency Department
Admission: EM | Admit: 2015-08-17 | Discharge: 2015-08-17 | Discharge status: Absent without leave | Payer: Medicare Other

## 2015-08-17 ENCOUNTER — Observation Stay
Admission: EM | Admit: 2015-08-17 | Discharge: 2015-08-18 | Disposition: A | Discharge status: Home / Self Care | Payer: Medicare Other | Attending: Obstetrics and Gynecology | Admitting: Obstetrics and Gynecology

## 2015-08-17 DIAGNOSIS — O9989 Other specified diseases and conditions complicating pregnancy, childbirth and the puerperium: Secondary | ICD-10-CM | POA: Insufficient documentation

## 2015-08-17 DIAGNOSIS — O4703 False labor before 37 completed weeks of gestation, third trimester: Principal | ICD-10-CM | POA: Insufficient documentation

## 2015-08-17 DIAGNOSIS — O09523 Supervision of elderly multigravida, third trimester: Secondary | ICD-10-CM | POA: Insufficient documentation

## 2015-08-17 DIAGNOSIS — Z86718 Personal history of other venous thrombosis and embolism: Secondary | ICD-10-CM | POA: Diagnosis not present

## 2015-08-17 DIAGNOSIS — Z3A36 36 weeks gestation of pregnancy: Secondary | ICD-10-CM | POA: Insufficient documentation

## 2015-08-17 DIAGNOSIS — R05 Cough: Secondary | ICD-10-CM | POA: Insufficient documentation

## 2015-08-17 LAB — URINALYSIS COMPLETE WITH MICROSCOPIC (ARMC ONLY)
BACTERIA UA: NONE SEEN
Bilirubin Urine: NEGATIVE
Glucose, UA: NEGATIVE mg/dL
HGB URINE DIPSTICK: NEGATIVE
Leukocytes, UA: NEGATIVE
NITRITE: NEGATIVE
PH: 6 (ref 5.0–8.0)
PROTEIN: NEGATIVE mg/dL
RBC / HPF: NONE SEEN RBC/hpf (ref 0–5)
SPECIFIC GRAVITY, URINE: 1.011 (ref 1.005–1.030)

## 2015-08-17 LAB — CBC
HEMATOCRIT: 33.8 % — AB (ref 35.0–47.0)
HEMOGLOBIN: 11.7 g/dL — AB (ref 12.0–16.0)
MCH: 32.9 pg (ref 26.0–34.0)
MCHC: 34.5 g/dL (ref 32.0–36.0)
MCV: 95.4 fL (ref 80.0–100.0)
Platelets: 160 10*3/uL (ref 150–440)
RBC: 3.54 MIL/uL — AB (ref 3.80–5.20)
RDW: 13.1 % (ref 11.5–14.5)
WBC: 6.5 10*3/uL (ref 3.6–11.0)

## 2015-08-17 LAB — INFLUENZA PANEL BY PCR (TYPE A & B)
H1N1FLUPCR: NOT DETECTED
INFLAPCR: NEGATIVE
INFLBPCR: NEGATIVE

## 2015-08-17 LAB — URINE DRUG SCREEN, QUALITATIVE (ARMC ONLY)
Amphetamines, Ur Screen: NOT DETECTED
BARBITURATES, UR SCREEN: NOT DETECTED
BENZODIAZEPINE, UR SCRN: NOT DETECTED
CANNABINOID 50 NG, UR ~~LOC~~: NOT DETECTED
Cocaine Metabolite,Ur ~~LOC~~: NOT DETECTED
MDMA (ECSTASY) UR SCREEN: NOT DETECTED
Methadone Scn, Ur: NOT DETECTED
Opiate, Ur Screen: NOT DETECTED
Phencyclidine (PCP) Ur S: NOT DETECTED
TRICYCLIC, UR SCREEN: NOT DETECTED

## 2015-08-17 LAB — TYPE AND SCREEN
ABO/RH(D): A POS
ANTIBODY SCREEN: NEGATIVE

## 2015-08-17 MED ORDER — TERBUTALINE SULFATE 1 MG/ML IJ SOLN
0.2500 mg | INTRAMUSCULAR | Status: AC
  Administered 2015-08-17: 0.25 mg via SUBCUTANEOUS

## 2015-08-17 MED ORDER — ACETAMINOPHEN 500 MG PO TABS
ORAL_TABLET | ORAL | Status: AC
  Administered 2015-08-17: 1000 mg via ORAL
  Filled 2015-08-17: qty 2

## 2015-08-17 MED ORDER — TERBUTALINE SULFATE 1 MG/ML IJ SOLN
INTRAMUSCULAR | Status: AC
  Administered 2015-08-17: 0.25 mg via SUBCUTANEOUS
  Filled 2015-08-17: qty 1

## 2015-08-17 MED ORDER — HYDROMORPHONE HCL 1 MG/ML IJ SOLN
0.5000 mg | Freq: Once | INTRAMUSCULAR | Status: AC
  Administered 2015-08-17: 0.5 mg via INTRAVENOUS
  Filled 2015-08-17: qty 1

## 2015-08-17 MED ORDER — LACTATED RINGERS IV BOLUS (SEPSIS)
1000.0000 mL | Freq: Once | INTRAVENOUS | Status: AC
  Administered 2015-08-17: 1000 mL via INTRAVENOUS

## 2015-08-17 MED ORDER — ACETAMINOPHEN 500 MG PO TABS
1000.0000 mg | ORAL_TABLET | Freq: Once | ORAL | Status: AC | PRN
  Administered 2015-08-17: 1000 mg via ORAL

## 2015-08-17 MED ORDER — SODIUM CHLORIDE 0.9 % IV SOLN
INTRAVENOUS | Status: DC
  Administered 2015-08-18: via INTRAVENOUS

## 2015-08-17 NOTE — Progress Notes (Signed)
Noted several variable decelerations with contractions with return to baseline, moderate variability, and accelerations.  An apparent late deceleration with a longer-lasting contraction with normal return to baseline, moderate variability, and accelerations.  Her pain is still strong in her lower back. No response to dilaudid.  Will try a dose of terbutaline to assess response. Thomasene MohairStephen Daemyn Gariepy, MD 08/17/2015 11:13 PM
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
## 2015-08-17 NOTE — Plan of Care (Signed)
Pt presents to l/d with c/o contractions and pressure. Pt seen at [facility] in [place] earlier today. Pt states she was 1 cm there. Pt states she took her lovenox earlier today. Also was prescribed an antibiotic for a tooth ache . Temp 100.6 dr Jean Rosenthaljackson aware.

## 2015-08-18 DIAGNOSIS — O4703 False labor before 37 completed weeks of gestation, third trimester: Secondary | ICD-10-CM | POA: Diagnosis not present

## 2015-08-18 MED ORDER — HYDROMORPHONE HCL 1 MG/ML IJ SOLN
INTRAMUSCULAR | Status: AC
  Administered 2015-08-18: 1 mg via INTRAVENOUS
  Filled 2015-08-18: qty 1

## 2015-08-18 MED ORDER — HYDROMORPHONE HCL 1 MG/ML IJ SOLN
1.0000 mg | Freq: Once | INTRAMUSCULAR | Status: AC
  Administered 2015-08-18: 1 mg via INTRAVENOUS

## 2015-08-18 MED ORDER — TRAZODONE HCL 50 MG PO TABS
50.0000 mg | ORAL_TABLET | Freq: Every day | ORAL | Status: DC
  Administered 2015-08-18: 50 mg via ORAL
  Filled 2015-08-18 (×3): qty 1

## 2015-08-18 MED ORDER — CLONAZEPAM 0.5 MG PO TABS
1.0000 mg | ORAL_TABLET | Freq: Once | ORAL | Status: AC
  Administered 2015-08-18: 1 mg via ORAL
  Filled 2015-08-18: qty 2

## 2015-08-18 NOTE — Final Progress Note (Signed)
Physician Final Progress Note  Patient ID: Jaclyn Dougherty MRN: [id number] DOB/AGE: March 31, 1976 40 y.o.  Admit date: 08/17/2015 Admitting provider: Conard Novak, MD Discharge date: 08/18/2015   Admission Diagnoses:  Contractions  Discharge Diagnoses:  Active Problems:   Labor and delivery, indication for care    Consults: None  Significant Findings/ Diagnostic Studies:  Results for orders placed or performed during the hospital encounter of 08/17/15 (from the past 24 hour(s))  Urinalysis complete, with microscopic ([facility] only)     Status: Abnormal   Collection Time: 08/17/15  8:03 PM  Result Value Ref Range   Color, Urine YELLOW (A) YELLOW   APPearance CLEAR (A) CLEAR   Glucose, UA NEGATIVE NEGATIVE mg/dL   Bilirubin Urine NEGATIVE NEGATIVE   Ketones, ur 1+ (A) NEGATIVE mg/dL   Specific Gravity, Urine 1.011 1.005 - 1.030   Hgb urine dipstick NEGATIVE NEGATIVE   pH 6.0 5.0 - 8.0   Protein, ur NEGATIVE NEGATIVE mg/dL   Nitrite NEGATIVE NEGATIVE   Leukocytes, UA NEGATIVE NEGATIVE   RBC / HPF NONE SEEN 0 - 5 RBC/hpf   WBC, UA 0-5 0 - 5 WBC/hpf   Bacteria, UA NONE SEEN NONE SEEN   Squamous Epithelial / LPF 0-5 (A) NONE SEEN   Mucous PRESENT   Urine Drug Screen, Qualitative ([facility] only)     Status: None   Collection Time: 08/17/15  8:03 PM  Result Value Ref Range   Tricyclic, Ur Screen NONE DETECTED NONE DETECTED   Amphetamines, Ur Screen NONE DETECTED NONE DETECTED   MDMA (Ecstasy)Ur Screen NONE DETECTED NONE DETECTED   Cocaine Metabolite,Ur [place] NONE DETECTED NONE DETECTED   Opiate, Ur Screen NONE DETECTED NONE DETECTED   Phencyclidine (PCP) Ur S NONE DETECTED NONE DETECTED   Cannabinoid 50 Ng, Ur [place] NONE DETECTED NONE DETECTED   Barbiturates, Ur Screen NONE DETECTED NONE DETECTED   Benzodiazepine, Ur Scrn NONE DETECTED NONE DETECTED   Methadone Scn, Ur NONE DETECTED NONE DETECTED  Type and screen     Status: None   Collection Time: 08/17/15  9:26 PM  Result Value Ref  Range   ABO/RH(D) A POS    Antibody Screen NEG    Sample Expiration 08/20/2015   Influenza panel by PCR (type A & B, H1N1)     Status: None   Collection Time: 08/17/15  9:26 PM  Result Value Ref Range   Influenza A By PCR NEGATIVE NEGATIVE   Influenza B By PCR NEGATIVE NEGATIVE   H1N1 flu by pcr NOT DETECTED NOT DETECTED  CBC     Status: Abnormal   Collection Time: 08/17/15  9:27 PM  Result Value Ref Range   WBC 6.5 3.6 - 11.0 K/uL   RBC 3.54 (L) 3.80 - 5.20 MIL/uL   Hemoglobin 11.7 (L) 12.0 - 16.0 g/dL   HCT 40.9 (L) 81.1 - 91.4 %   MCV 95.4 80.0 - 100.0 fL   MCH 32.9 26.0 - 34.0 pg   MCHC 34.5 32.0 - 36.0 g/dL   RDW 78.2 95.6 - 21.3 %   Platelets 160 150 - 440 K/uL    Procedures: NST 2 minute decel noted at 2100 on 08/18/15 prompted overnight monitoring.  Throughout night displayed good variability, with consisted accelerations, occasional variables but had normal AFI at [facility] same day.  Discharge Condition: stable  Disposition: ED Dismiss - Never Arrived  Diet: Regular diet  Discharge Activity: Activity as tolerated  Discharge Instructions    Discharge diet:  No restrictions  Complete by:  As directed      Fetal Kick Count:  Lie on our left side for one hour after a meal, and count the number of times your baby kicks.  If it is less than 5 times, get up, move around and drink some juice.  Repeat the test 30 minutes later.  If it is still less than 5 kicks in an hour, notify your doctor.    Complete by:  As directed      No sexual activity restrictions    Complete by:  As directed      Notify physician for a general feeling that "something is not right"    Complete by:  As directed      Notify physician for increase or change in vaginal discharge    Complete by:  As directed      Notify physician for intestinal cramps, with or without diarrhea, sometimes described as "gas pain"    Complete by:  As directed      Notify physician for leaking of fluid    Complete by:  As  directed      Notify physician for low, dull backache, unrelieved by heat or Tylenol    Complete by:  As directed      Notify physician for menstrual like cramps    Complete by:  As directed      Notify physician for pelvic pressure    Complete by:  As directed      Notify physician for uterine contractions.  These may be painless and feel like the uterus is tightening or the baby is  "balling up"    Complete by:  As directed      Notify physician for vaginal bleeding    Complete by:  As directed      PRETERM LABOR:  Includes any of the follwing symptoms that occur between 20 - [redacted] weeks gestation.  If these symptoms are not stopped, preterm labor can result in preterm delivery, placing your baby at risk    Complete by:  As directed             Medication List    STOP taking these medications        clonazePAM 1 MG tablet  Commonly known as:  KLONOPIN      TAKE these medications        enoxaparin 40 MG/0.4ML injection  Commonly known as:  LOVENOX  Inject 0.4 mLs (40 mg total) into the skin daily.     multivitamin with minerals Tabs tablet  Take 1 tablet by mouth daily.     traZODone 50 MG tablet  Commonly known as:  DESYREL  Take 50 mg by mouth at bedtime.         Total time spent taking care of this patient: 60 minutes  Signed: Lorrene ReidSTAEBLER, Icyss Skog M 08/18/2015, 8:53 AM
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
## 2015-08-18 NOTE — Discharge Instructions (Signed)
Please keep scheduled appointment on Friday 08/24/15 with [facility].
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
## 2015-08-20 LAB — CHLAMYDIA/NGC RT PCR (ARMC ONLY)
Chlamydia Tr: NOT DETECTED
N gonorrhoeae: NOT DETECTED

## 2015-08-24 ENCOUNTER — Observation Stay
Admission: EM | Admit: 2015-08-24 | Discharge: 2015-08-24 | Disposition: A | Discharge status: Home / Self Care | Payer: Medicare Other | Attending: Obstetrics and Gynecology | Admitting: Obstetrics and Gynecology

## 2015-08-24 ENCOUNTER — Encounter: Payer: Self-pay | Admitting: *Deleted

## 2015-08-24 DIAGNOSIS — Z9119 Patient's noncompliance with other medical treatment and regimen: Secondary | ICD-10-CM

## 2015-08-24 DIAGNOSIS — Z3A36 36 weeks gestation of pregnancy: Secondary | ICD-10-CM | POA: Diagnosis not present

## 2015-08-24 DIAGNOSIS — O99343 Other mental disorders complicating pregnancy, third trimester: Secondary | ICD-10-CM | POA: Insufficient documentation

## 2015-08-24 DIAGNOSIS — Z91199 Patient's noncompliance with other medical treatment and regimen due to unspecified reason: Secondary | ICD-10-CM

## 2015-08-24 DIAGNOSIS — F17210 Nicotine dependence, cigarettes, uncomplicated: Secondary | ICD-10-CM | POA: Insufficient documentation

## 2015-08-24 DIAGNOSIS — Z91410 Personal history of adult physical and sexual abuse: Secondary | ICD-10-CM | POA: Insufficient documentation

## 2015-08-24 DIAGNOSIS — O09523 Supervision of elderly multigravida, third trimester: Secondary | ICD-10-CM | POA: Insufficient documentation

## 2015-08-24 DIAGNOSIS — IMO0002 Reserved for concepts with insufficient information to code with codable children: Secondary | ICD-10-CM | POA: Clinically undetermined

## 2015-08-24 DIAGNOSIS — O4703 False labor before 37 completed weeks of gestation, third trimester: Secondary | ICD-10-CM | POA: Diagnosis present

## 2015-08-24 DIAGNOSIS — O9989 Other specified diseases and conditions complicating pregnancy, childbirth and the puerperium: Secondary | ICD-10-CM | POA: Diagnosis not present

## 2015-08-24 DIAGNOSIS — Z86718 Personal history of other venous thrombosis and embolism: Secondary | ICD-10-CM | POA: Diagnosis not present

## 2015-08-24 DIAGNOSIS — O99333 Smoking (tobacco) complicating pregnancy, third trimester: Secondary | ICD-10-CM | POA: Insufficient documentation

## 2015-08-24 DIAGNOSIS — F319 Bipolar disorder, unspecified: Secondary | ICD-10-CM | POA: Insufficient documentation

## 2015-08-24 DIAGNOSIS — O479 False labor, unspecified: Secondary | ICD-10-CM | POA: Diagnosis present

## 2015-08-24 DIAGNOSIS — O4700 False labor before 37 completed weeks of gestation, unspecified trimester: Secondary | ICD-10-CM | POA: Diagnosis present

## 2015-08-24 NOTE — Discharge Summary (Signed)
OB Triage Discharge Summary   40 y.o. [id number] @ 2549w2d presenting for uterine contractions.  Fetal status reassuring. EFM showed category I with accels and UCs that spaced from q5 to q6766m. Triage course: SVE unchanged at FT-1cm/long/high over 3.5-4hrs and 3 SVEs  Labs pending: none Disposition: Home  [place] Bingharlie Blaklee [place], Montez HagemanJr MD [facility]  Pager: [phone]

## 2015-08-24 NOTE — Progress Notes (Signed)
OB note Still category I with accels UCs appear spaced out to about q2743m NAD Pt states UCs feel stronger and she feels them q6953m SVE unchanged  Offered pt d/c to home or can re-check again in 1h for more reassurance for her and she elects the latter.  Jaclyn Dougherty, Jr MD [facility]  Pager: [phone]
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
## 2015-09-03 ENCOUNTER — Observation Stay
Admission: EM | Admit: 2015-09-03 | Discharge: 2015-09-03 | Disposition: A | Discharge status: Home / Self Care | Payer: Medicare Other | Source: Home / Self Care | Admitting: Obstetrics and Gynecology

## 2015-09-03 DIAGNOSIS — O471 False labor at or after 37 completed weeks of gestation: Secondary | ICD-10-CM

## 2015-09-03 DIAGNOSIS — Z3A37 37 weeks gestation of pregnancy: Secondary | ICD-10-CM

## 2015-09-03 DIAGNOSIS — O99333 Smoking (tobacco) complicating pregnancy, third trimester: Secondary | ICD-10-CM | POA: Insufficient documentation

## 2015-09-03 DIAGNOSIS — Z86718 Personal history of other venous thrombosis and embolism: Secondary | ICD-10-CM

## 2015-09-03 DIAGNOSIS — O99343 Other mental disorders complicating pregnancy, third trimester: Secondary | ICD-10-CM

## 2015-09-03 DIAGNOSIS — O09523 Supervision of elderly multigravida, third trimester: Secondary | ICD-10-CM | POA: Insufficient documentation

## 2015-09-03 DIAGNOSIS — O09293 Supervision of pregnancy with other poor reproductive or obstetric history, third trimester: Secondary | ICD-10-CM

## 2015-09-03 DIAGNOSIS — F17210 Nicotine dependence, cigarettes, uncomplicated: Secondary | ICD-10-CM | POA: Insufficient documentation

## 2015-09-03 DIAGNOSIS — O479 False labor, unspecified: Secondary | ICD-10-CM | POA: Diagnosis present

## 2015-09-03 DIAGNOSIS — F309 Manic episode, unspecified: Secondary | ICD-10-CM | POA: Insufficient documentation

## 2015-09-03 MED ORDER — BUTORPHANOL TARTRATE 1 MG/ML IJ SOLN
2.0000 mg | Freq: Once | INTRAMUSCULAR | Status: AC
  Administered 2015-09-03: 2 mg via INTRAMUSCULAR

## 2015-09-03 MED ORDER — BUTORPHANOL TARTRATE 1 MG/ML IJ SOLN
INTRAMUSCULAR | Status: AC
  Administered 2015-09-03: 2 mg via INTRAMUSCULAR
  Filled 2015-09-03: qty 2
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
## 2015-09-03 NOTE — OB Triage Note (Signed)
Pt. reports contractions starting at approximately 1900 this evening. Denies LOF; has had some bloody show. Rating contractions a 9 out of 10 intensity.

## 2015-09-06 ENCOUNTER — Inpatient Hospital Stay
Admission: EM | Admit: 2015-09-06 | Discharge: 2015-09-08 | DRG: 774 | Disposition: A | Discharge status: Home / Self Care | Payer: Medicare Other | Source: Intra-hospital | Attending: Certified Nurse Midwife | Admitting: Certified Nurse Midwife

## 2015-09-06 ENCOUNTER — Encounter: Payer: Self-pay | Admitting: *Deleted

## 2015-09-06 DIAGNOSIS — Z79899 Other long term (current) drug therapy: Secondary | ICD-10-CM

## 2015-09-06 DIAGNOSIS — I825Z3 Chronic embolism and thrombosis of unspecified deep veins of distal lower extremity, bilateral: Secondary | ICD-10-CM | POA: Diagnosis present

## 2015-09-06 DIAGNOSIS — O2233 Deep phlebothrombosis in pregnancy, third trimester: Secondary | ICD-10-CM | POA: Diagnosis present

## 2015-09-06 DIAGNOSIS — Z3A38 38 weeks gestation of pregnancy: Secondary | ICD-10-CM | POA: Diagnosis not present

## 2015-09-06 DIAGNOSIS — O871 Deep phlebothrombosis in the puerperium: Principal | ICD-10-CM | POA: Diagnosis present

## 2015-09-06 DIAGNOSIS — O09523 Supervision of elderly multigravida, third trimester: Secondary | ICD-10-CM

## 2015-09-06 DIAGNOSIS — F4310 Post-traumatic stress disorder, unspecified: Secondary | ICD-10-CM | POA: Diagnosis present

## 2015-09-06 DIAGNOSIS — Z91410 Personal history of adult physical and sexual abuse: Secondary | ICD-10-CM | POA: Diagnosis not present

## 2015-09-06 DIAGNOSIS — F419 Anxiety disorder, unspecified: Secondary | ICD-10-CM | POA: Diagnosis present

## 2015-09-06 DIAGNOSIS — Z9119 Patient's noncompliance with other medical treatment and regimen: Secondary | ICD-10-CM

## 2015-09-06 DIAGNOSIS — O99344 Other mental disorders complicating childbirth: Secondary | ICD-10-CM | POA: Diagnosis present

## 2015-09-06 LAB — CREATININE, SERUM
Creatinine, Ser: 0.37 mg/dL — ABNORMAL LOW (ref 0.44–1.00)
GFR calc Af Amer: >60 mL/min (ref >60)
GFR calc non Af Amer: >60 mL/min (ref >60)

## 2015-09-06 LAB — CBC
HEMATOCRIT: 36.9 % (ref 35.0–47.0)
Hemoglobin: 12.7 g/dL (ref 12.0–16.0)
MCH: 32.3 pg (ref 26.0–34.0)
MCHC: 34.4 g/dL (ref 32.0–36.0)
MCV: 94 fL (ref 80.0–100.0)
Platelets: 200 10*3/uL (ref 150–440)
RBC: 3.92 MIL/uL (ref 3.80–5.20)
RDW: 12.7 % (ref 11.5–14.5)
WBC: 11.5 10*3/uL — ABNORMAL HIGH (ref 3.6–11.0)

## 2015-09-06 LAB — TYPE AND SCREEN
ABO/RH(D): A POS
ANTIBODY SCREEN: NEGATIVE

## 2015-09-06 LAB — URINE DRUG SCREEN, QUALITATIVE (ARMC ONLY)
AMPHETAMINES, UR SCREEN: NOT DETECTED
BENZODIAZEPINE, UR SCRN: NOT DETECTED
Barbiturates, Ur Screen: NOT DETECTED
COCAINE METABOLITE, UR ~~LOC~~: NOT DETECTED
Cannabinoid 50 Ng, Ur ~~LOC~~: NOT DETECTED
MDMA (ECSTASY) UR SCREEN: NOT DETECTED
METHADONE SCREEN, URINE: NOT DETECTED
OPIATE, UR SCREEN: NOT DETECTED
PHENCYCLIDINE (PCP) UR S: NOT DETECTED
Tricyclic, Ur Screen: NOT DETECTED

## 2015-09-06 MED ORDER — OXYTOCIN 40 UNITS IN LACTATED RINGERS INFUSION - SIMPLE MED
INTRAVENOUS | Status: AC
  Filled 2015-09-06: qty 1000

## 2015-09-06 MED ORDER — SIMETHICONE 80 MG PO CHEW: 80.0000 mg | CHEWABLE_TABLET | ORAL | Status: DC | PRN

## 2015-09-06 MED ORDER — OXYTOCIN 40 UNITS IN LACTATED RINGERS INFUSION - SIMPLE MED: 2.5000 [IU]/h | INTRAVENOUS | Status: DC

## 2015-09-06 MED ORDER — LACTATED RINGERS IV SOLN: 500.0000 mL | INTRAVENOUS | Status: DC | PRN

## 2015-09-06 MED ORDER — LIDOCAINE HCL (PF) 1 % IJ SOLN
INTRAMUSCULAR | Status: AC
  Filled 2015-09-06: qty 30

## 2015-09-06 MED ORDER — MISOPROSTOL 200 MCG PO TABS
800.0000 ug | ORAL_TABLET | Freq: Once | ORAL | Status: DC | PRN
  Filled 2015-09-06: qty 4

## 2015-09-06 MED ORDER — DIBUCAINE 1 % RE OINT: 1.0000 "application " | TOPICAL_OINTMENT | RECTAL | Status: DC | PRN

## 2015-09-06 MED ORDER — ENOXAPARIN SODIUM 40 MG/0.4ML ~~LOC~~ SOLN
40.0000 mg | SUBCUTANEOUS | Status: DC
  Administered 2015-09-06 – 2015-09-07 (×2): 40 mg via SUBCUTANEOUS
  Filled 2015-09-06 (×2): qty 0.4

## 2015-09-06 MED ORDER — OXYTOCIN BOLUS FROM INFUSION: 500.0000 mL | INTRAVENOUS | Status: DC

## 2015-09-06 MED ORDER — ONDANSETRON HCL 4 MG/2ML IJ SOLN: 4.0000 mg | INTRAMUSCULAR | Status: DC | PRN

## 2015-09-06 MED ORDER — BENZOCAINE-MENTHOL 20-0.5 % EX AERO: 1.0000 "application " | INHALATION_SPRAY | CUTANEOUS | Status: DC | PRN

## 2015-09-06 MED ORDER — AMMONIA AROMATIC IN INHA
RESPIRATORY_TRACT | Status: AC
  Filled 2015-09-06: qty 10

## 2015-09-06 MED ORDER — BUTORPHANOL TARTRATE 1 MG/ML IJ SOLN
2.0000 mg | INTRAMUSCULAR | Status: DC | PRN
Start: 2015-09-06 — End: 2015-09-06
  Administered 2015-09-06: 2 mg via INTRAVENOUS
  Filled 2015-09-06: qty 2

## 2015-09-06 MED ORDER — CLONAZEPAM 0.5 MG PO TABS
1.0000 mg | ORAL_TABLET | Freq: Two times a day (BID) | ORAL | Status: DC | PRN
  Administered 2015-09-06 – 2015-09-08 (×4): 1 mg via ORAL
  Filled 2015-09-06 (×4): qty 2

## 2015-09-06 MED ORDER — ONDANSETRON HCL 4 MG PO TABS: 4.0000 mg | ORAL_TABLET | ORAL | Status: DC | PRN

## 2015-09-06 MED ORDER — IBUPROFEN 600 MG PO TABS
600.0000 mg | ORAL_TABLET | Freq: Four times a day (QID) | ORAL | Status: DC | PRN
  Administered 2015-09-06 – 2015-09-08 (×8): 600 mg via ORAL
  Filled 2015-09-06 (×7): qty 1

## 2015-09-06 MED ORDER — ONDANSETRON HCL 4 MG/2ML IJ SOLN: 4.0000 mg | Freq: Four times a day (QID) | INTRAMUSCULAR | Status: DC | PRN

## 2015-09-06 MED ORDER — MISOPROSTOL 200 MCG PO TABS
ORAL_TABLET | ORAL | Status: AC
Start: 2015-09-06 — End: 2015-09-06
  Filled 2015-09-06: qty 4

## 2015-09-06 MED ORDER — PRENATAL MULTIVITAMIN CH
1.0000 | ORAL_TABLET | Freq: Every day | ORAL | Status: DC
  Administered 2015-09-06 – 2015-09-08 (×3): 1 via ORAL
  Filled 2015-09-06 (×3): qty 1

## 2015-09-06 MED ORDER — LANOLIN HYDROUS EX OINT
TOPICAL_OINTMENT | CUTANEOUS | Status: DC | PRN
Start: 2015-09-06 — End: 2015-09-08

## 2015-09-06 MED ORDER — OXYTOCIN 10 UNIT/ML IJ SOLN
INTRAMUSCULAR | Status: AC
  Filled 2015-09-06: qty 2

## 2015-09-06 MED ORDER — LACTATED RINGERS IV SOLN: INTRAVENOUS | Status: DC

## 2015-09-06 MED ORDER — TRAZODONE HCL 50 MG PO TABS
50.0000 mg | ORAL_TABLET | Freq: Every evening | ORAL | Status: DC | PRN
  Administered 2015-09-07 – 2015-09-08 (×2): 50 mg via ORAL
  Filled 2015-09-06 (×3): qty 1

## 2015-09-06 MED ORDER — IBUPROFEN 600 MG PO TABS
ORAL_TABLET | ORAL | Status: AC
  Administered 2015-09-06: 600 mg via ORAL
  Filled 2015-09-06: qty 1

## 2015-09-06 MED ORDER — FERROUS SULFATE 325 (65 FE) MG PO TABS
325.0000 mg | ORAL_TABLET | Freq: Every day | ORAL | Status: DC
  Administered 2015-09-07 – 2015-09-08 (×2): 325 mg via ORAL
  Filled 2015-09-06 (×2): qty 1

## 2015-09-06 MED ORDER — OXYCODONE-ACETAMINOPHEN 5-325 MG PO TABS
1.0000 | ORAL_TABLET | ORAL | Status: AC | PRN
  Administered 2015-09-06: 2 via ORAL
  Administered 2015-09-06: 1 via ORAL
  Administered 2015-09-06 – 2015-09-07 (×3): 2 via ORAL
  Filled 2015-09-06 (×3): qty 2
  Filled 2015-09-06: qty 1
  Filled 2015-09-06 (×2): qty 2

## 2015-09-06 MED ORDER — DOCUSATE SODIUM 100 MG PO CAPS
100.0000 mg | ORAL_CAPSULE | Freq: Every day | ORAL | Status: DC
  Administered 2015-09-06 – 2015-09-08 (×3): 100 mg via ORAL
  Filled 2015-09-06 (×3): qty 1

## 2015-09-06 MED ORDER — LIDOCAINE HCL (PF) 1 % IJ SOLN: 30.0000 mL | INTRAMUSCULAR | Status: DC | PRN

## 2015-09-06 MED ORDER — BENZOCAINE-MENTHOL 20-0.5 % EX AERO
INHALATION_SPRAY | CUTANEOUS | Status: AC
  Administered 2015-09-06: 09:00:00
  Filled 2015-09-06: qty 56

## 2015-09-06 MED ORDER — AMMONIA AROMATIC IN INHA: 0.3000 mL | Freq: Once | RESPIRATORY_TRACT | Status: DC | PRN

## 2015-09-06 MED ORDER — WITCH HAZEL-GLYCERIN EX PADS
1.0000 "application " | MEDICATED_PAD | CUTANEOUS | Status: DC | PRN
  Filled 2015-09-06: qty 100

## 2015-09-06 NOTE — Progress Notes (Signed)
[facility] regarding Lovenox Rx. Plan is 40 mg once daily beginning 12 hours pp and continuing for 6 weeks. Pt understands but is concerned about the change in dose since she still has 80 mg syringes at home. She says this will make it difficult to get the prescription because of the need for pre-authorization. Reassurance given to pt that this issue will be resolved.  Tresea MallGLEDHILL,Bayley Hurn, CNM   This patient and plan were discussed with Dr Bonney AidStaebler 09/06/2015
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
## 2015-09-06 NOTE — Progress Notes (Signed)
[facility] to [facility],social worker, on the phone regarding Social work consult pending for this patient.  Once FOB leaves room call [place].  If FOB is still in room at 4:30 Maxine GlennMonica will try to come by today before leaving at 5 or will come see patient tomorrow morning 09/07/15.
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
## 2015-09-06 NOTE — Progress Notes (Signed)
Instructed patient on not sleeping with baby in arms.  Asked pt if I could place baby in bassinet, patient refused.  Safe sleep practices discussed with patient and husband.

## 2015-09-06 NOTE — Progress Notes (Signed)
Talked with [facility] Ovidio [facility]Ellistad [facility] this AM regarding dose of Lovenox recommended postpartum. She recommended 40 mgm Lovenox daily subcut to start 12 hrs pp and continue x 6 weeks. Creatinine added on to blood in lab.  Farrel ConnersGUTIERREZ, [name], CNM
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
## 2015-09-07 LAB — CBC
HCT: 32.7 % — ABNORMAL LOW (ref 35.0–47.0)
Hemoglobin: 11.2 g/dL — ABNORMAL LOW (ref 12.0–16.0)
MCH: 32.6 pg (ref 26.0–34.0)
MCHC: 34.2 g/dL (ref 32.0–36.0)
MCV: 95.1 fL (ref 80.0–100.0)
PLATELETS: 187 10*3/uL (ref 150–440)
RBC: 3.43 MIL/uL — AB (ref 3.80–5.20)
RDW: 12.9 % (ref 11.5–14.5)
WBC: 9.3 10*3/uL (ref 3.6–11.0)

## 2015-09-07 LAB — RPR: RPR: NONREACTIVE

## 2015-09-07 LAB — RAPID HIV SCREEN (HIV 1/2 AB+AG)
HIV 1/2 Antibodies: NONREACTIVE
HIV-1 P24 Antigen - HIV24: NONREACTIVE

## 2015-09-07 MED ORDER — OXYCODONE-ACETAMINOPHEN 5-325 MG PO TABS
ORAL_TABLET | ORAL | Status: AC
  Administered 2015-09-07: 1 via ORAL
  Filled 2015-09-07: qty 1

## 2015-09-07 MED ORDER — OXYCODONE-ACETAMINOPHEN 5-325 MG PO TABS
1.0000 | ORAL_TABLET | Freq: Four times a day (QID) | ORAL | Status: DC | PRN
  Administered 2015-09-07: 1 via ORAL

## 2015-09-07 MED ORDER — OXYCODONE-ACETAMINOPHEN 5-325 MG PO TABS
2.0000 | ORAL_TABLET | Freq: Four times a day (QID) | ORAL | Status: AC | PRN
  Administered 2015-09-07 – 2015-09-08 (×4): 2 via ORAL
  Filled 2015-09-07 (×5): qty 2

## 2015-09-07 NOTE — Progress Notes (Signed)
Post Partum Day 1 Subjective: [facility] patient. Doing well, no complaints.  Tolerating regular diet, pain with PO meds, voiding and ambulating without difficulty.  No CP SOB F/C N/V or leg pain No HA, change of vision, RUQ/epigastric pain  Objective: BP 105/58 mmHg  Pulse 62  Temp(Src) 98.4 F (36.9 C) (Oral)  Resp 20  SpO2 98%  LMP 12/13/2014  Breastfeeding  Physical Exam:  General: NAD CV: RRR Pulm: nl effort, CTABL Lochia: moderate Uterine Fundus: fundus firm and below umbilicus DVT Evaluation: no cords, ttp LEs    Recent Labs  09/06/15 0750 09/07/15 0611  HGB 12.7 11.2*  HCT 36.9 32.7*  WBC 11.5* 9.3  PLT 200 187    Assessment/Plan: 40 y.o. [id number] stable postpartum day # 1  1. Continue routine postpartum care 2. Rapid HIV due to no record of 3rd trimester lab 3. Breastfeeding 4. Contraceptive: unsure at this time 5. Lovenox: 40 mg q day x [facility] 6. Pt continues on home meds Clonazepam and Trazodone 7. A+/RI/VI/TDAP UTD    Tresea MallGLEDHILL,Abraham Entwistle, CNM   This patient and plan were discussed with Dr Elesa MassedWard 09/07/2015

## 2015-09-07 NOTE — Care Management Important Message (Signed)
Important Message  Patient Details  Name: Fanny DanceDestiny Wittwer MRN: [id number] Date of Birth: 1975-09-05   Medicare Important Message Given:  Yes    Gwenette GreetBrenda S Forney Kleinpeter, RN 09/07/2015, 11:38 AM
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
## 2015-09-07 NOTE — Care Management (Signed)
Care Management consult discontinued. Subject matter was completed by Clinical Social Worker Monica Maye HidesMarra Gwenette GreetBrenda S Gerold Sar RN MSN CCM Care Management   [phone]

## 2015-09-08 LAB — HIV ANTIBODY (ROUTINE TESTING W REFLEX): HIV Screen 4th Generation wRfx: NONREACTIVE

## 2015-09-08 MED ORDER — OXYCODONE-ACETAMINOPHEN 5-325 MG PO TABS: 1.0000 | ORAL_TABLET | Freq: Four times a day (QID) | ORAL | Status: DC | PRN

## 2015-09-08 MED ORDER — ENOXAPARIN SODIUM 40 MG/0.4ML ~~LOC~~ SOLN: 40.0000 mg | SUBCUTANEOUS | Status: DC

## 2015-09-08 MED ORDER — OXYCODONE-ACETAMINOPHEN 5-325 MG PO TABS
1.0000 | ORAL_TABLET | Freq: Four times a day (QID) | ORAL | Status: DC | PRN
  Administered 2015-09-08: 2 via ORAL
  Filled 2015-09-08: qty 2

## 2015-09-08 MED ORDER — CONCEPT DHA 53.5-38-1 MG PO CAPS: 1.0000 | ORAL_CAPSULE | Freq: Every day | ORAL | Status: DC

## 2015-09-08 MED ORDER — ENOXAPARIN SODIUM 80 MG/0.8ML ~~LOC~~ SOLN
1.0000 mg/kg | Freq: Two times a day (BID) | SUBCUTANEOUS | Status: DC
  Filled 2015-09-08 (×2): qty 0.8

## 2015-09-08 NOTE — Progress Notes (Signed)
Dr. Vergie LivingPickens called to request Lovenox to be held until he is able to [facility].  MD to call back to notify to discontinue or give.  Reynold BowenSusan Paisley Anyiah Coverdale, RN 09/08/2015 11:38 AM

## 2015-09-08 NOTE — Progress Notes (Signed)
OB Note Still haven't heard back from MFM after 2nd page. Given that it was already d/w them and they knew she had been increased in dose prior to delivery and still recommended ppx dosing, will keep pt on this, per their recommendations.   Jaclyn Dougherty, Jr MD [facility]  Pager: [phone]

## 2015-09-08 NOTE — Discharge Instructions (Signed)
Call your doctor for increased pain or vaginal bleeding, temperature above 100.4, depression, or concerns.  No strenuous activity or heavy lifting for 6 weeks.  No intercourse, tampons, douching, or enemas for 6 weeks.  No tub baths-showers only.  No driving for 2 weeks or while taking pain medications.  Continue prenatal vitamin and iron.  Increase calories and fluids while breastfeeding. ° °Vaginal Delivery, Care After °Refer to this sheet in the next few weeks. These discharge instructions provide you with information on caring for yourself after delivery. Your caregiver may also give you specific instructions. Your treatment has been planned according to the most current medical practices available, but problems sometimes occur. Call your caregiver if you have any problems or questions after you go home. °HOME CARE INSTRUCTIONS °1. Take over-the-counter or prescription medicines only as directed by your caregiver or pharmacist. °2. Do not drink alcohol, especially if you are breastfeeding or taking medicine to relieve pain. °3. Do not smoke tobacco. °4. Continue to use good perineal care. Good perineal care includes: °1. Wiping your perineum from back to front °2. Keeping your perineum clean. °3. You can do sitz baths twice a day, to help keep this area clean °5. Do not use tampons, douche or have sex until your caregiver says it is okay. °6. Shower only and avoid sitting in submerged water, aside from sitz baths °7. Wear a well-fitting bra that provides breast support. °8. Eat healthy foods. °9. Drink enough fluids to keep your urine clear or pale yellow. °10. Eat high-fiber foods such as whole grain cereals and breads, brown rice, beans, and fresh fruits and vegetables every day. These foods may help prevent or relieve constipation. °11. Avoid constipation with high fiber foods or medications, such as miralax or metamucil °12. Follow your caregiver's recommendations regarding resumption of activities such as  climbing stairs, driving, lifting, exercising, or traveling. °13. Talk to your caregiver about resuming sexual activities. Resumption of sexual activities is dependent upon your risk of infection, your rate of healing, and your comfort and desire to resume sexual activity. °14. Try to have someone help you with your household activities and your newborn for at least a few days after you leave the hospital. °15. Rest as much as possible. Try to rest or take a nap when your newborn is sleeping. °16. Increase your activities gradually. °17. Keep all of your scheduled postpartum appointments. It is very important to keep your scheduled follow-up appointments. At these appointments, your caregiver will be checking to make sure that you are healing physically and emotionally. °SEEK MEDICAL CARE IF:  °· You are passing large clots from your vagina. Save any clots to show your caregiver. °· You have a foul smelling discharge from your vagina. °· You have trouble urinating. °· You are urinating frequently. °· You have pain when you urinate. °· You have a change in your bowel movements. °· You have increasing redness, pain, or swelling near your vaginal incision (episiotomy) or vaginal tear. °· You have pus draining from your episiotomy or vaginal tear. °· Your episiotomy or vaginal tear is separating. °· You have painful, hard, or reddened breasts. °· You have a severe headache. °· You have blurred vision or see spots. °· You feel sad or depressed. °· You have thoughts of hurting yourself or your newborn. °· You have questions about your care, the care of your newborn, or medicines. °· You are dizzy or light-headed. °· You have a rash. °· You have nausea or   vomiting. °· You were breastfeeding and have not had a menstrual period within 12 weeks after you stopped breastfeeding. °· You are not breastfeeding and have not had a menstrual period by the 12th week after delivery. °· You have a fever. °SEEK IMMEDIATE MEDICAL CARE IF:   °· You have persistent pain. °· You have chest pain. °· You have shortness of breath. °· You faint. °· You have leg pain. °· You have stomach pain. °· Your vaginal bleeding saturates two or more sanitary pads in 1 hour. °MAKE SURE YOU:  °· Understand these instructions. °· Will watch your condition. °· Will get help right away if you are not doing well or get worse. °Document Released: 05/23/2000 Document Revised: 10/10/2013 Document Reviewed: 01/21/2012 °ExitCare® Patient Information ©2015 [employer]. This information is not intended to replace advice given to you by your health care provider. Make sure you discuss any questions you have with your health care provider. ° °Sitz Bath °A sitz bath is a warm water bath taken in the sitting position. The water covers only the hips and butt (buttocks). We recommend using one that fits in the toilet, to help with ease of use and cleanliness. It may be used for either healing or cleaning purposes. Sitz baths are also used to relieve pain, itching, or muscle tightening (spasms). The water may contain medicine. Moist heat will help you heal and relax.  °HOME CARE  °Take 3 to 4 sitz baths a day. °18. Fill the bathtub half-full with warm water. °19. Sit in the water and open the drain a little. °20. Turn on the warm water to keep the tub half-full. Keep the water running constantly. °21. Soak in the water for 15 to 20 minutes. °22. After the sitz bath, pat the affected area dry. °GET HELP RIGHT AWAY IF: °You get worse instead of better. Stop the sitz baths if you get worse. °MAKE SURE YOU: °· Understand these instructions. °· Will watch your condition. °· Will get help right away if you are not doing well or get worse. °Document Released: 07/03/2004 Document Revised: 02/18/2012 Document Reviewed: 09/23/2010 °ExitCare® Patient Information ©2015 [employer]. This information is not intended to replace advice given to you by your health care provider. Make sure you discuss  any questions you have with your health care provider. ° ° °

## 2015-09-08 NOTE — Progress Notes (Signed)
RN made contract with pt to cluster care; RN to come in room when pain medication is due; pt is good with this and is really appreciative of clustering care ("i'm so tired and there is a lot of people that come in here, but it really helped during the day what that nurse did") (clustering care)

## 2015-09-08 NOTE — Progress Notes (Signed)
Pt called out for trazadone; at this time, percocet is available and RN is getting medication from pyxis; RN discussed with pt that trazadone is ordered "PRN at bedtime" and mentioned that the pt didn't call out for the medication earlier; pt insistent that "i have to have it for any kind of sleep, it just hit me that i didn't take it last night"; pt also has history of anxiety so pt is ready to have "any kind of sleep, I just need my trazadone for sleep"; RN brought this medication but also reminded pt that it was 0430 and please call out for help if you are groggy or sleepy or need help getting out of bed; the father of the baby is at the bedside
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
## 2015-09-08 NOTE — Progress Notes (Signed)
Discharge instructions provided.  Pt verbalizes understanding of all instructions and follow-up care.  Pt discharged to home with infant at 1800 on 09/08/15 via wheelchair by CNA. Reynold BowenSusan Paisley Lynell Greenhouse, RN 09/08/2015 8:18 PM

## 2015-09-08 NOTE — Progress Notes (Signed)
OB Note [facility] pager paged to see if patient needs to be back on wgt based dosing like she was on just prior to delivery. RN asked to update weight in the computer.  Jaclyn Dougherty, Jr MD [facility]  Pager: [phone]
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
## 2015-09-08 NOTE — Progress Notes (Addendum)
OB Note Still haven't heard back from [facility] but based on practice bulletin pt should be on, postpartum, at least the same dose they were on before they delivered but CNM Sharen HonesGutierrez talked with MFM after delivery and they recommend only ppx (Dr Quin HoopEllestad). The notes are somewhat confusing so I'll try again to touch base with them to see if that's what they want. .  I also discontinued her motrin PRN, given that she's on anti-coagulation.  Jaclyn Dougherty, Jr MD [facility]  Pager: [phone]

## 2015-09-11 ENCOUNTER — Telehealth: Payer: Self-pay | Admitting: Obstetrics and Gynecology

## 2015-09-11 NOTE — Telephone Encounter (Signed)
OB Telephone Note  I got in touch with Dr. Fayrene FearingJames about [facility] and she agrees that the patient should be on the antepartum dosing of 70mg  bid. I called Ms. Daphine DeutscherMartin @ [phone] and the VM identified it as Therapist, occupationalDestiny Parton's phone. I told her about the increase in dosing and asked her to call our offices to confirm that she had received the message. I also told her to make a 1-2 wk appt with the [facility] to follow up.  Cornelia Copaharlie Tyja Gortney, Jr MD [facility]  Pager: [phone]

## 2015-10-04 ENCOUNTER — Ambulatory Visit: Payer: Medicare Other

## 2015-10-18 ENCOUNTER — Telehealth: Payer: Self-pay

## 2015-10-18 ENCOUNTER — Other Ambulatory Visit: Payer: Self-pay | Admitting: Obstetrics and Gynecology

## 2015-10-18 DIAGNOSIS — I82599 Chronic embolism and thrombosis of other specified deep vein of unspecified lower extremity: Secondary | ICD-10-CM

## 2015-10-18 MED ORDER — ENOXAPARIN SODIUM 40 MG/0.4ML ~~LOC~~ SOLN: 40.0000 mg | SUBCUTANEOUS | Status: DC

## 2015-10-18 NOTE — Telephone Encounter (Signed)
Tawny Hoppingarol Wood, RN Child Care Coordinator from [facility] called to inform [facility] that patient was unable to get lovenox refilled due to priorauthorization needed for medication.  Dr. Leatha GildingLivingston notified, new orders for lovenox prescribed.  Prescription called in to [facility] @ 1715 via pharmacist per electronic order.  See copied order below.  Patient rescheduled for M[facility] consult Monday, 10-22-15 @ 1000, Tawny Hoppingarol Wood, RN notified and states she will notify the patient.    Dose: 40 mg Route: Subcutaneous Frequency: Every 24 hours    Indications of Use: Deep Vein Thrombosis    Dispense Quantity:  7 Syringe Refills:  3 Fills Remaining:  3            Sig: Inject 0.4 mLs (40 mg total) into the skin daily.           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

## 2015-10-22 ENCOUNTER — Ambulatory Visit
Admission: RE | Admit: 2015-10-22 | Discharge: 2015-10-22 | Disposition: A | Discharge status: Home / Self Care | Payer: Medicare Other | Source: Ambulatory Visit | Attending: Maternal & Fetal Medicine | Admitting: Maternal & Fetal Medicine

## 2015-10-22 ENCOUNTER — Institutional Professional Consult (permissible substitution): Payer: Medicare Other

## 2015-10-22 VITALS — BP 118/65 | HR 89 | Temp 98.4°F | Resp 18 | Ht 65.0 in | Wt 147.0 lb

## 2015-10-22 DIAGNOSIS — I82593 Chronic embolism and thrombosis of other specified deep vein of lower extremity, bilateral: Secondary | ICD-10-CM

## 2015-10-22 DIAGNOSIS — Z7901 Long term (current) use of anticoagulants: Secondary | ICD-10-CM | POA: Diagnosis not present

## 2015-10-22 MED ORDER — ENOXAPARIN SODIUM 100 MG/ML ~~LOC~~ SOLN: 100.0000 mg | SUBCUTANEOUS | Status: DC

## 2015-10-22 NOTE — Addendum Note (Signed)
Encounter addended by: Lady DeutscherAndra Jaline Pincock, MD on: 10/22/2015  5:08 PM<BR>     Documentation filed: Notes Section

## 2015-10-22 NOTE — Progress Notes (Addendum)
[facility] Consultation   Chief Complaint: History of blood clots in her lower extremities   HPI: [name] is a 40 y.o. [id number] S/P SVD 09/06/2015 who has been referred to me for recommendations regarding long-term anticoagulation.  Her postpartum course has been uncomplicated.  She is currently breastfeeding.  She is accompanied today by the father of the baby and her infant.  The patient reports the following episodes VTE: 1.  2014 - DVT dx'd and treated at [facility] 2.  2015 - [place]urrent [place] in [place] 3.  2015 - [place]  The patient attributes these episodes to lack of physical activity.   The patient has previously been evaluated by Dr. Orlie DakinFinnegan.  As I understand from his notes, a thrombophilia work-up was only positive LAC, which was negative.  The patient understood from the last time she saw Dr. Orlie DakinFinnegan, which was before this most recent pregnancy, that she would not need life-long anticoagulation.  This was corroborated by his note which suggested that she would need life-long anticoagulation were she to experience another VTE.  When she was taking anticoagulation, she took warfarin, but according to the patient, her dose was difficult to regulate.  She wound up on Eloquis, and then Lovenox when periconceptionally and prenatally.  In the interim, the patient has been pregnant and delivered.  In early March, the patient was seen emerg[facility] on the obstetrical unit due to concern for acute DVT.  [facility]2015 revealed: 1. Nonocclusive thrombus within the right common femoral vein and proximal greater saphenous vein. 2. Nonocclusive thrombus in the left common femoral vein, greater saphenous vein, and mid and distal femoral vein.  Her anticoagulation during that admission was increased to full-dose Lovenox (1 mg/kg bid).    She ultimately delivered at [facility] and was discharged on  Lovenox 40 mg per day.  Obstetric History:  Obstetric History   G11  P6   T3   P3   A5   TAB1   SAB4   E0   M0   L6     # Outcome Date GA Lbr Len/2nd Weight Sex Delivery Anes PTL Lv  11 Term 09/06/15 4240w1d 01:47 / 00:13 6 lb 8.4 oz (2.96 kg) M Vag-Spont None  Y     Name: Dougherty,Jaclyn Dougherty     Apgar1:  4                Apgar5: 9  10 Preterm 08/08/10 6478w0d    Vag-Spont   Y  9 Preterm 07/16/05 3434w0d    Vag-Spont  Y Y  8 Preterm 02/01/04 11034w0d    Vag-Spont   Y  7 Term 08/19/02 6640w0d    Vag-Spont   Y  6 Term 10/11/93 2850w0d    Vag-Spont   Y  5 SAB           4 SAB           3 SAB           2 TAB           1 SAB               Gynecologic History:   No vaginal bleeding now.   3 cm fibroid on early ultrasound last pregnancy.  Past Medical History: Patient  has a past medical history of DVT (deep venous thrombosis) (HCC) (01/2014); Anemia; Clotting disorder (HCC); UTI (lower urinary tract infection); and  Mania (HCC).   Past Surgical History: She  has past surgical history that includes Tonsillectomy; Breast reconstruction (Bilateral, 2000); and Dilation and curettage of uterus (July, 2015 ).   Medications:  Current Outpatient Prescriptions on File Prior to Encounter  Medication Sig Dispense Refill  . clonazePAM (KLONOPIN) 1 MG tablet Take 1 mg by mouth 2 (two) times daily.    . Prenat-FeFum-FePo-FA-Omega 3 (CONCEPT DHA) 53.5-38-1 MG CAPS Take 1 capsule by mouth daily after breakfast. 30 capsule 11  . traZODone (DESYREL) 50 MG tablet Take 50 mg by mouth at bedtime.     Marland Kitchen oxyCODONE-acetaminophen (PERCOCET/ROXICET) 5-325 MG tablet Take 1-2 tablets by mouth every 6 (six) hours as needed for moderate pain or severe pain. (Patient not taking: Reported on 10/22/2015) 30 tablet 0  Also taking Lovenox 40 mg qd Has a prescription for progestin only birth control pills  Allergies: Patient has No Known Allergies.   Social History: Patient  reports that she quit smoking about 20 months ago. Her  smoking use included Cigarettes. She does not have any smokeless tobacco history on file. She reports that she does not drink alcohol or use illicit drugs.   Family History: Positive for VTE   Review of Systems The patient is complaining of ankle pain and swelling on the left, otherwise, a full 12 point review of systems was negative or as noted in the History of Present Illness.  Physical Exam: BP 118/65 mmHg  Pulse 89  Temp(Src) 98.4 F (36.9 C)  Resp 18  Ht  (1.651 m)  Wt 147 lb (66.679 kg)  BMI 24.46 kg/m2  SpO2 97% Lungs - Clear Heart - RRR Abdomen - nontender Extremities - nontender, no edema, no erythema  Asessement: 40 yo gravida 11 now para 3356 who is 6 weeks postpartum and breastfeeding with: 1. 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 Pain in her L ankle and dorsum of her foot and no recent trauma - benign exam 3. Does not want permanent sterilization, but does not want to get pregnant right away  Recommendations: 1. 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 Orlie Dakin.  The patient is reluctant to continue on anticoagulation.  She does not see the necessity, now that she is active.  I explained that our general recommendation for someone with more than one unprovoked thromboembolism, especially with evidence of non-occlusive thrombi on ultrasound,  is lifelong anticoagulation.  We are requesting an appt for Dr. Orlie Dakin (on a Tuesday per the patient) ASAP. I will try to speak to Dr. Orlie Dakin in the interim.  She will also need a provider who can follow this problem long term. -- Her options for anticoagulation while breastfeeding are warfarin and LMWH.  She will not consider warfarin.  I have given her a prescription for Lovenox 100 mg per day (1.5 mg/kg/d)  to be filled at the [facility], where  we have obtained pre-authorization.  -- She was counseled that when she has weaned the baby, she could take Eloquis or another anticoagulant.   2. Pain in her L ankle and dorsum of her foot and no recent trauma - benign exam -- I recommended lower extremity Dopplers today.  She cannot stay, but they have been scheduled for tomorrow. 3. Does not want permanent sterilization, but does not want to get pregnant right away -- I  counseled the patient about the options of long-acting reversible contraceptives such as the IUD (Mirena or Copper), or Nexplanon subcutaneous implant.  The Mirena, which reduces menstrual bleeding, would be preferred for someone on anticoagulation.   -- As long as she is fully anticoagulated, any contraceptive (including hormonal contraceptives) would be acceptable.  Were she to stop anticoagulation, she should not take a hormonal contraceptive that increases her risk of thrombosis (estrogen containing, or Depo Provera).     Total time spent with the patient was 40 minutes with greater than 50% spent in counseling and coordination of care.   Argentina Ponder, MD [facility]

## 2015-10-23 ENCOUNTER — Ambulatory Visit: Payer: Medicare Other

## 2015-10-30 ENCOUNTER — Inpatient Hospital Stay: Payer: Medicare Other | Admitting: Oncology

## 2015-11-01 ENCOUNTER — Ambulatory Visit: Payer: Medicare Other

## 2015-11-19 ENCOUNTER — Inpatient Hospital Stay: Payer: Medicare Other | Attending: Oncology | Admitting: Oncology

## 2015-11-19 VITALS — BP 122/81 | HR 87 | Temp 98.7°F | Resp 18 | Wt 148.8 lb

## 2015-11-19 DIAGNOSIS — Z7901 Long term (current) use of anticoagulants: Secondary | ICD-10-CM | POA: Diagnosis not present

## 2015-11-19 DIAGNOSIS — Z87891 Personal history of nicotine dependence: Secondary | ICD-10-CM | POA: Insufficient documentation

## 2015-11-19 DIAGNOSIS — Z86718 Personal history of other venous thrombosis and embolism: Secondary | ICD-10-CM

## 2015-11-19 DIAGNOSIS — Z79899 Other long term (current) drug therapy: Secondary | ICD-10-CM | POA: Insufficient documentation

## 2015-11-19 DIAGNOSIS — I82409 Acute embolism and thrombosis of unspecified deep veins of unspecified lower extremity: Secondary | ICD-10-CM
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
## 2015-11-25 NOTE — Progress Notes (Signed)
[facility]  Telephone:([phone]) [phone] Fax:(336) [phone]  ID: Fanny Dance OB: 1976/05/06  MR#: [id number]  AOZ#:[id number]  Patient Care Team: No Pcp Per Patient as PCP - General (General Practice)  CHIEF COMPLAINT:  Chief Complaint  Patient presents with  . New Patient (Initial Visit)    DVT    INTERVAL HISTORY: Patient last evaluated In clinic in July 2016. She has been referred back for further evaluation and discussion of long-term anticoagulation use. Patient has a history of multiple DVTs and by report she had a recent one, but patient denies this. She is approximately 6 weeks postpartum and required Lovenox during her pregnancy. She has no neurologic complaints.  She denies any recent fevers.  She has no chest pain or shortness of breath.  She has a good appetite and denies weight loss.  She has no nausea, vomiting, constipation, or diarrhea.  She has no urinary complaints.  Patient offers no further specific complaints.  REVIEW OF SYSTEMS:   Review of Systems  Constitutional: Negative.  Negative for fever, weight loss and malaise/fatigue.  Respiratory: Negative.  Negative for hemoptysis and shortness of breath.   Cardiovascular: Negative.  Negative for chest pain.  Gastrointestinal: Negative.  Negative for blood in stool and melena.  Genitourinary: Negative.   Musculoskeletal: Negative.   Neurological: Negative.  Negative for weakness.  Psychiatric/Behavioral: Positive for depression. Negative for suicidal ideas. The patient is nervous/anxious.     As per HPI. Otherwise, a complete review of systems is negatve.  PAST MEDICAL HISTORY: Past Medical History  Diagnosis Date  . DVT (deep venous thrombosis) (HCC) 01/2014  . Anemia   . Clotting disorder (HCC)   . UTI (lower urinary tract infection)   . Mania (HCC)     PAST SURGICAL HISTORY: Past Surgical History  Procedure Laterality Date  . Tonsillectomy    . Breast reconstruction Bilateral 2000   breast augumentation  . Dilation and curettage of uterus  July, 2015     miscarriage @ 10 weeks     FAMILY HISTORY: Mother with reported DVT, otherwise negative and noncontributory.     ADVANCED DIRECTIVES:    HEALTH MAINTENANCE: Social History  Substance Use Topics  . Smoking status: Former Smoker    Types: Cigarettes    Quit date: 01/26/2014  . Smokeless tobacco: Not on file     Comment: smokes electronic cigarettes  . Alcohol Use: No     Comment: social      Colonoscopy:  PAP:  Bone density:  Lipid panel:  No Known Allergies  Current Outpatient Prescriptions  Medication Sig Dispense Refill  . benztropine (COGENTIN) 0.5 MG tablet Take by mouth.    . clonazePAM (KLONOPIN) 1 MG tablet Take 1 mg by mouth 2 (two) times daily.    . DOCOSAHEXAENOIC ACID PO Take by mouth.    . enoxaparin (LOVENOX) 100 MG/ML injection Inject 1 mL (100 mg total) into the skin daily. 30 Syringe 11  . Fenugreek 610 MG CAPS Take 3 tablets by mouth 3 (three) times daily.    Marland Kitchen NIFEdipine (PROCARDIA) 10 MG capsule Take by mouth.    . NON FORMULARY 2 capsules 2 (two) times daily.    . norethindrone (MICRONOR,CAMILA,ERRIN) 0.35 MG tablet Take by mouth.    . OLANZapine (ZYPREXA) 15 MG tablet Take by mouth.    . oxyCODONE-acetaminophen (PERCOCET/ROXICET) 5-325 MG tablet Take 1-2 tablets by mouth every 6 (six) hours as needed for moderate pain or severe pain. 30 tablet 0  .  Prenat-FeFum-FePo-FA-Omega 3 (CONCEPT DHA) 53.5-38-1 MG CAPS Take 1 capsule by mouth daily after breakfast. 30 capsule 11  . traZODone (DESYREL) 50 MG tablet Take 50 mg by mouth at bedtime.      No current facility-administered medications for this visit.    OBJECTIVE: Filed Vitals:   11/19/15 1106  BP: 122/81  Pulse: 87  Temp: 98.7 F (37.1 C)  Resp: 18     Body mass index is 24.76 kg/(m^2).    ECOG FS:0 - Asymptomatic  General: Well-developed, well-nourished, no acute distress. Eyes: Pink conjunctiva, anicteric  sclera. Lungs: Clear to auscultation bilaterally. Heart: Regular rate and rhythm. No rubs, murmurs, or gallops. Abdomen: Soft, nontender, nondistended. No organomegaly noted, normoactive bowel sounds. Musculoskeletal: No edema, cyanosis, or clubbing. Neuro: Alert, answering all questions appropriately. Cranial nerves grossly intact. Skin: No rashes or petechiae noted. Psych: Normal affect.   LAB RESULTS:  Lab Results  Component Value Date   NA 136 08/11/2015   K 3.4* 08/11/2015   CL 107 08/11/2015   CO2 21* 08/11/2015   GLUCOSE 101* 08/11/2015   BUN 8 08/11/2015   CREATININE 0.37* 09/06/2015   CALCIUM 9.3 08/11/2015   PROT 6.6 08/11/2015   ALBUMIN 3.2* 08/11/2015   AST 22 08/11/2015   ALT 11* 08/11/2015   ALKPHOS 129* 08/11/2015   BILITOT 0.5 08/11/2015   GFRNONAA >60 09/06/2015   GFRAA >60 09/06/2015    Lab Results  Component Value Date   WBC 9.3 09/07/2015   NEUTROABS 4.8 07/19/2014   HGB 11.2* 09/07/2015   HCT 32.7* 09/07/2015   MCV 95.1 09/07/2015   PLT 187 09/07/2015     STUDIES: No results found.  ASSESSMENT: History of DVT.  PLAN:    1. History of DVT: Previously, patient's entire hypercoagulable workup including factor V 5 Leiden and prothrombin gene mutation was reported as negative. Although it has been recommended that she requires lifelong anticoagulation by multiple physicians, she has stated that she does not wish to pursue this and does not feel it is necessary. She states that she will complete her current prescription in the next several days and then discontinue. Patient expressed understanding to the risks of discontinuing her anticoagulation despite physician recommendation.  Patient has a history of multiple DVTs, but did agree that if she has another blood clot that she would consider lifelong treatment. No follow-up has been scheduled.   Approximately 30 minutes was spent in discussion and consultation.  Patient expressed understanding and  was in agreement with this plan. She also understands that She can call clinic at any time with any questions, concerns, or complaints.    Jeralyn Ruthsimothy J Finnegan, MD   11/25/2015 11:06 PM

## 2016-05-10 IMAGING — CR DG CHEST 2V
2 series · 2 of 2 positions shown · non-contrast
Comparison: Chest radiograph and CTA of the chest performed
07/19/2014

CLINICAL DATA: Sharp left-sided chest pain, acute onset. Initial
encounter.

EXAM:
CHEST  2 VIEW

[w chest pa]
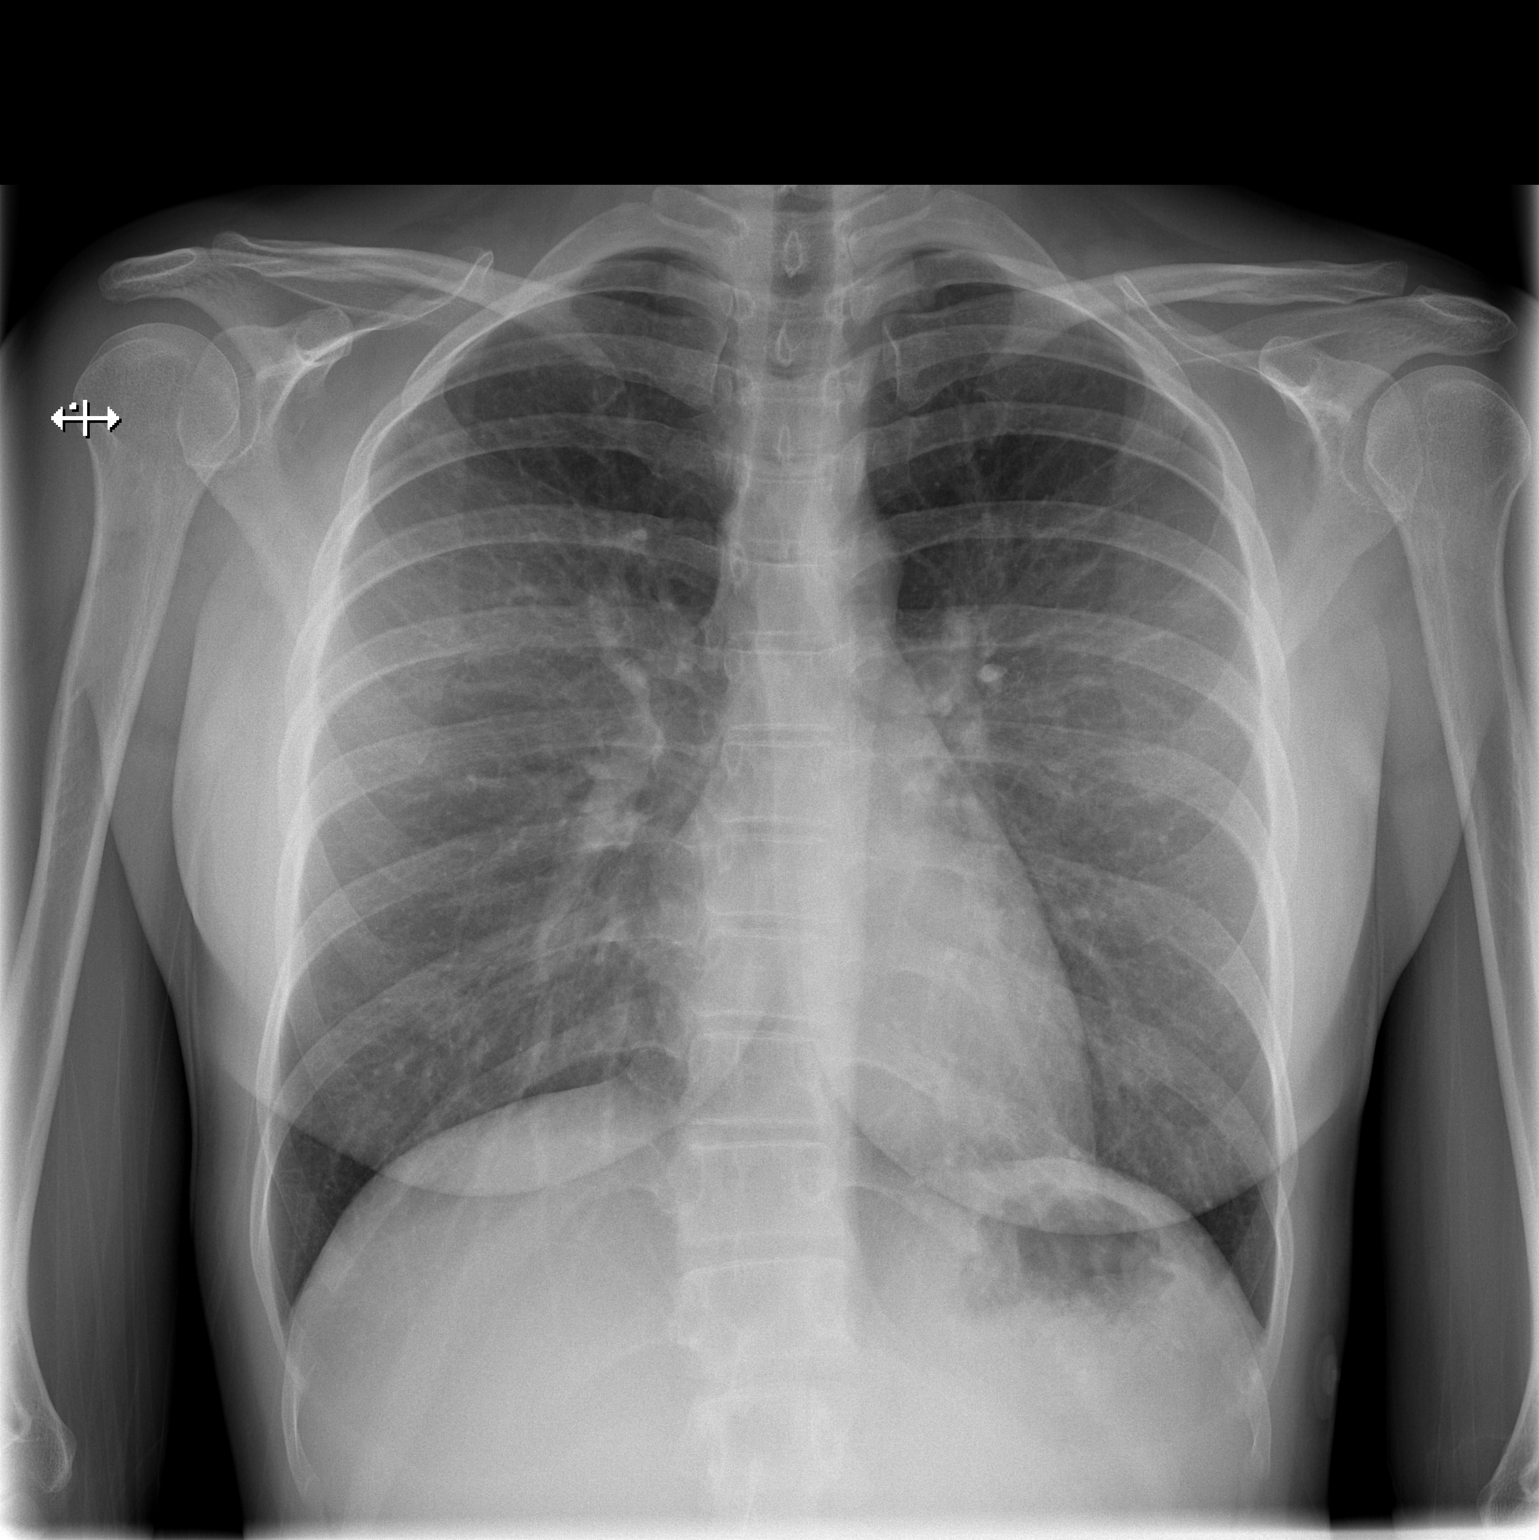

[w chest lat]
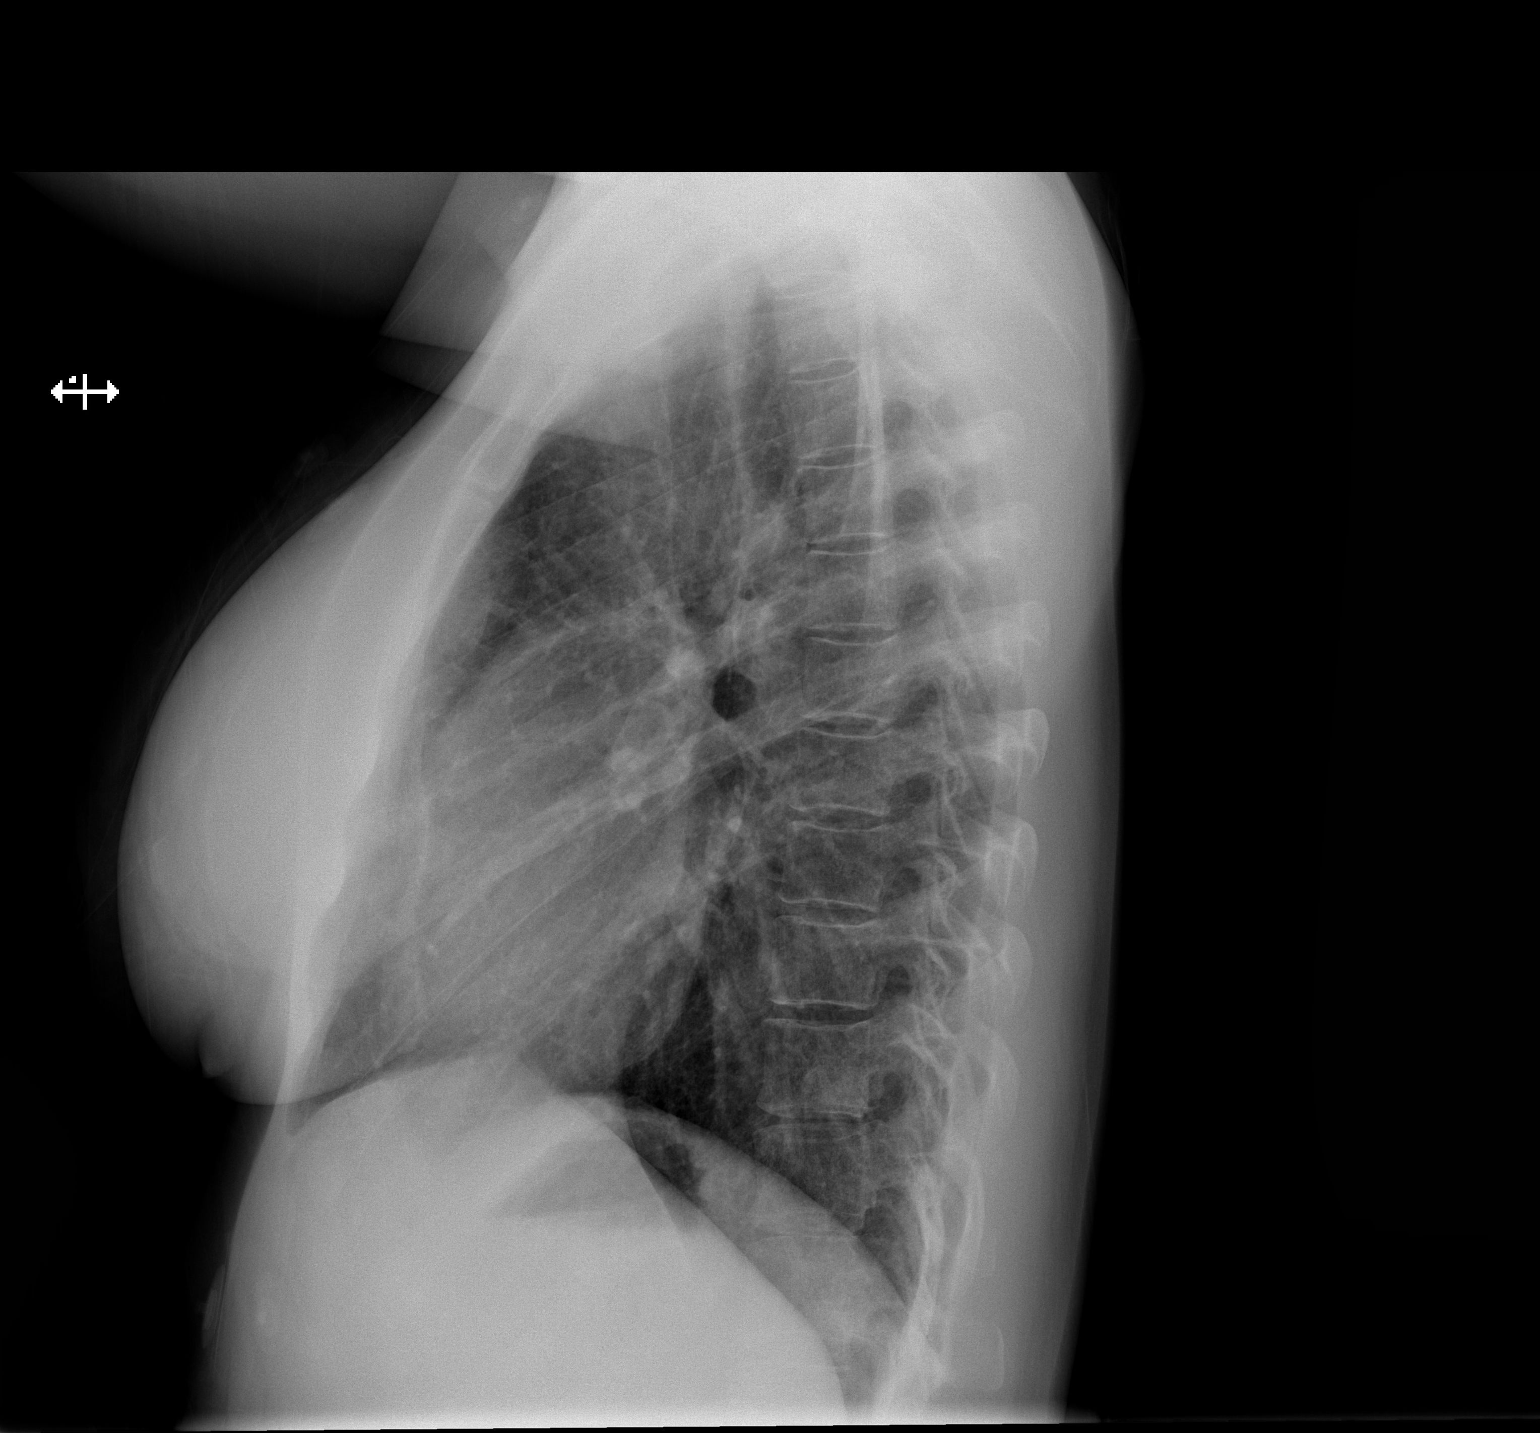

[2 of 2 positions shown; findings below may reference images not displayed]

FINDINGS: The lungs are well-aerated and clear. There is no evidence of focal
opacification, pleural effusion or pneumothorax.

The heart is normal in size; the mediastinal contour is within
normal limits. No acute osseous abnormalities are seen.
IMPRESSION: No acute cardiopulmonary process seen. No displaced rib fractures
identified.

## 2016-05-17 ENCOUNTER — Emergency Department
Admission: EM | Admit: 2016-05-17 | Discharge: 2016-05-17 | Disposition: A | Discharge status: Home / Self Care | Payer: Medicare Other | Attending: Emergency Medicine | Admitting: Emergency Medicine

## 2016-05-17 ENCOUNTER — Emergency Department: Payer: Medicare Other

## 2016-05-17 ENCOUNTER — Encounter: Payer: Self-pay | Admitting: Emergency Medicine

## 2016-05-17 DIAGNOSIS — J069 Acute upper respiratory infection, unspecified: Secondary | ICD-10-CM | POA: Insufficient documentation

## 2016-05-17 DIAGNOSIS — B9789 Other viral agents as the cause of diseases classified elsewhere: Secondary | ICD-10-CM

## 2016-05-17 DIAGNOSIS — Z87891 Personal history of nicotine dependence: Secondary | ICD-10-CM | POA: Diagnosis not present

## 2016-05-17 DIAGNOSIS — Z79899 Other long term (current) drug therapy: Secondary | ICD-10-CM | POA: Insufficient documentation

## 2016-05-17 DIAGNOSIS — R05 Cough: Secondary | ICD-10-CM | POA: Diagnosis present

## 2016-05-17 DIAGNOSIS — Z7901 Long term (current) use of anticoagulants: Secondary | ICD-10-CM | POA: Insufficient documentation

## 2016-05-17 LAB — INFLUENZA PANEL BY PCR (TYPE A & B)
INFLAPCR: NEGATIVE
INFLBPCR: NEGATIVE

## 2016-05-17 MED ORDER — IPRATROPIUM-ALBUTEROL 0.5-2.5 (3) MG/3ML IN SOLN
3.0000 mL | Freq: Once | RESPIRATORY_TRACT | Status: AC
  Administered 2016-05-17: 3 mL via RESPIRATORY_TRACT

## 2016-05-17 MED ORDER — DEXTROMETHORPHAN POLISTIREX ER 30 MG/5ML PO SUER
30.0000 mg | Freq: Once | ORAL | Status: AC
  Administered 2016-05-17: 30 mg via ORAL
  Filled 2016-05-17: qty 5

## 2016-05-17 MED ORDER — ALBUTEROL SULFATE HFA 108 (90 BASE) MCG/ACT IN AERS: 2.0000 | INHALATION_SPRAY | Freq: Four times a day (QID) | RESPIRATORY_TRACT | 0 refills | Status: DC | PRN

## 2016-05-17 MED ORDER — ACETAMINOPHEN-CODEINE #3 300-30 MG PO TABS: 1.0000 | ORAL_TABLET | Freq: Three times a day (TID) | ORAL | 0 refills | Status: DC | PRN

## 2016-05-17 MED ORDER — IPRATROPIUM-ALBUTEROL 0.5-2.5 (3) MG/3ML IN SOLN
RESPIRATORY_TRACT | Status: AC
  Administered 2016-05-17: 3 mL via RESPIRATORY_TRACT
  Filled 2016-05-17: qty 3

## 2016-05-17 MED ORDER — GUAIFENESIN-DM 100-10 MG/5ML PO SYRP: 5.0000 mL | ORAL_SOLUTION | ORAL | 0 refills | Status: DC | PRN

## 2016-05-17 MED ORDER — GUAIFENESIN-CODEINE 100-10 MG/5ML PO SOLN
10.0000 mL | Freq: Once | ORAL | Status: AC
  Administered 2016-05-17: 10 mL via ORAL
  Filled 2016-05-17: qty 10

## 2016-05-17 MED ORDER — ONDANSETRON 4 MG PO TBDP
4.0000 mg | ORAL_TABLET | Freq: Once | ORAL | Status: AC
  Administered 2016-05-17: 4 mg via ORAL
  Filled 2016-05-17: qty 1

## 2016-05-17 NOTE — ED Notes (Signed)
Pt states she is lethargic, alert and oriented x 4, speaking in complete sentences. Coughing, diarrhea, vomiting, fever, congestion.
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
## 2016-05-17 NOTE — Discharge Instructions (Signed)
Your exam, chest x-ray, and flu swab were essentially normal today. You likely have an upper respiratory infection with mild bronchitis. Symptoms like yours are likely caused by a virus. Take the prescription meds as directed. Increase fluid intake and consider a room humidifier. Follow-up with your provider or [facility] as needed.

## 2016-05-17 NOTE — ED Triage Notes (Signed)
Cough and runny nose.  NAD

## 2016-05-17 NOTE — ED Notes (Signed)
Pt given ginger ale.

## 2016-08-26 ENCOUNTER — Emergency Department: Payer: Medicare Other

## 2016-08-26 ENCOUNTER — Emergency Department
Admission: EM | Admit: 2016-08-26 | Discharge: 2016-08-26 | Disposition: A | Discharge status: Home / Self Care | Payer: Medicare Other | Attending: Emergency Medicine | Admitting: Emergency Medicine

## 2016-08-26 ENCOUNTER — Encounter: Payer: Self-pay | Admitting: Emergency Medicine

## 2016-08-26 DIAGNOSIS — R0602 Shortness of breath: Secondary | ICD-10-CM | POA: Diagnosis not present

## 2016-08-26 DIAGNOSIS — Z87891 Personal history of nicotine dependence: Secondary | ICD-10-CM | POA: Diagnosis not present

## 2016-08-26 DIAGNOSIS — R0789 Other chest pain: Secondary | ICD-10-CM

## 2016-08-26 DIAGNOSIS — Z79899 Other long term (current) drug therapy: Secondary | ICD-10-CM | POA: Diagnosis not present

## 2016-08-26 DIAGNOSIS — R079 Chest pain, unspecified: Secondary | ICD-10-CM | POA: Diagnosis present

## 2016-08-26 LAB — CBC WITH DIFFERENTIAL/PLATELET
BASOS PCT: 0 %
Basophils Absolute: 0 10*3/uL (ref 0–0.1)
EOS ABS: 0.1 10*3/uL (ref 0–0.7)
Eosinophils Relative: 1 %
HCT: 35.8 % (ref 35.0–47.0)
Hemoglobin: 12.5 g/dL (ref 12.0–16.0)
Lymphocytes Relative: 25 %
Lymphs Abs: 1.4 10*3/uL (ref 1.0–3.6)
MCH: 33.9 pg (ref 26.0–34.0)
MCHC: 34.8 g/dL (ref 32.0–36.0)
MCV: 97.6 fL (ref 80.0–100.0)
MONO ABS: 0.3 10*3/uL (ref 0.2–0.9)
MONOS PCT: 6 %
Neutro Abs: 3.8 10*3/uL (ref 1.4–6.5)
Neutrophils Relative %: 68 %
Platelets: 255 10*3/uL (ref 150–440)
RBC: 3.67 MIL/uL — ABNORMAL LOW (ref 3.80–5.20)
RDW: 12.9 % (ref 11.5–14.5)
WBC: 5.5 10*3/uL (ref 3.6–11.0)

## 2016-08-26 LAB — COMPREHENSIVE METABOLIC PANEL
ALBUMIN: 4.5 g/dL (ref 3.5–5.0)
ALT: 16 U/L (ref 14–54)
ANION GAP: 7 (ref 5–15)
AST: 30 U/L (ref 15–41)
Alkaline Phosphatase: 52 U/L (ref 38–126)
BILIRUBIN TOTAL: 0.5 mg/dL (ref 0.3–1.2)
BUN: 13 mg/dL (ref 6–20)
CALCIUM: 9.5 mg/dL (ref 8.9–10.3)
CO2: 25 mmol/L (ref 22–32)
Chloride: 104 mmol/L (ref 101–111)
Creatinine, Ser: 0.68 mg/dL (ref 0.44–1.00)
GFR calc non Af Amer: >60 mL/min (ref >60)
GLUCOSE: 96 mg/dL (ref 65–99)
POTASSIUM: 4.1 mmol/L (ref 3.5–5.1)
SODIUM: 136 mmol/L (ref 135–145)
TOTAL PROTEIN: 7.4 g/dL (ref 6.5–8.1)

## 2016-08-26 LAB — TROPONIN I
Troponin I: <0.03 ng/mL (ref <0.03)
Troponin I: <0.03 ng/mL (ref <0.03)

## 2016-08-26 LAB — FIBRIN DERIVATIVES D-DIMER (ARMC ONLY): FIBRIN DERIVATIVES D-DIMER (ARMC): 358.6 (ref 0.00–499.00)

## 2016-08-26 MED ORDER — CYCLOBENZAPRINE HCL 5 MG PO TABS: 5.0000 mg | ORAL_TABLET | Freq: Three times a day (TID) | ORAL | 0 refills | Status: AC | PRN

## 2016-08-26 MED ORDER — MORPHINE SULFATE (PF) 4 MG/ML IV SOLN
4.0000 mg | Freq: Once | INTRAVENOUS | Status: AC
  Administered 2016-08-26: 4 mg via INTRAVENOUS
  Filled 2016-08-26: qty 1

## 2016-08-26 MED ORDER — LORAZEPAM 2 MG/ML IJ SOLN
1.0000 mg | Freq: Once | INTRAMUSCULAR | Status: AC
  Administered 2016-08-26: 1 mg via INTRAVENOUS
  Filled 2016-08-26: qty 1

## 2016-08-26 MED ORDER — KETOROLAC TROMETHAMINE 30 MG/ML IJ SOLN
30.0000 mg | Freq: Once | INTRAMUSCULAR | Status: AC
  Administered 2016-08-26: 30 mg via INTRAVENOUS
  Filled 2016-08-26: qty 1

## 2016-08-26 MED ORDER — SODIUM CHLORIDE 0.9 % IV BOLUS (SEPSIS)
1000.0000 mL | Freq: Once | INTRAVENOUS | Status: AC
  Administered 2016-08-26: 1000 mL via INTRAVENOUS

## 2016-08-26 MED ORDER — DIAZEPAM 5 MG/ML IJ SOLN: 5.0000 mg | Freq: Once | INTRAMUSCULAR | Status: DC

## 2016-08-26 MED ORDER — HYDROCODONE-ACETAMINOPHEN 5-325 MG PO TABS: 1.0000 | ORAL_TABLET | Freq: Four times a day (QID) | ORAL | 0 refills | Status: AC | PRN

## 2016-08-26 NOTE — ED Notes (Signed)
Patient transported to X-ray 
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
## 2016-08-26 NOTE — Discharge Instructions (Signed)
Take motrin for pain.   Take flexeril for muscle spasms.   Take vicodin for severe pain. DO NOT drive with it.  Return to ER if you have severe chest pain, trouble breathing, fevers, worse shortness of breath.
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
## 2016-08-26 NOTE — ED Triage Notes (Signed)
Pt to ed with c/o chest pain and sob, worse with deep breath, hx of DVT, blood clots, pt reports recent pregnancy.

## 2017-02-10 ENCOUNTER — Encounter: Payer: Self-pay | Admitting: *Deleted

## 2017-02-10 ENCOUNTER — Emergency Department
Admission: EM | Admit: 2017-02-10 | Discharge: 2017-02-10 | Disposition: A | Discharge status: Home / Self Care | Payer: Medicare Other | Attending: Emergency Medicine | Admitting: Emergency Medicine

## 2017-02-10 DIAGNOSIS — K0889 Other specified disorders of teeth and supporting structures: Secondary | ICD-10-CM | POA: Diagnosis not present

## 2017-02-10 DIAGNOSIS — Z87891 Personal history of nicotine dependence: Secondary | ICD-10-CM | POA: Insufficient documentation

## 2017-02-10 DIAGNOSIS — Z79899 Other long term (current) drug therapy: Secondary | ICD-10-CM | POA: Insufficient documentation

## 2017-02-10 MED ORDER — CLINDAMYCIN PHOSPHATE 600 MG/4ML IJ SOLN
600.0000 mg | Freq: Once | INTRAMUSCULAR | Status: AC
  Administered 2017-02-10: 600 mg via INTRAMUSCULAR
  Filled 2017-02-10: qty 4

## 2017-02-10 MED ORDER — CLINDAMYCIN HCL 150 MG PO CAPS: ORAL_CAPSULE | ORAL | 0 refills | Status: DC

## 2017-02-10 MED ORDER — ACETAMINOPHEN-CODEINE #3 300-30 MG PO TABS: 1.0000 | ORAL_TABLET | Freq: Three times a day (TID) | ORAL | 0 refills | Status: AC | PRN

## 2017-02-10 MED ORDER — CLINDAMYCIN PHOSPHATE 300 MG/2ML IJ SOLN
INTRAMUSCULAR | Status: AC
  Filled 2017-02-10: qty 4

## 2017-02-10 NOTE — ED Notes (Signed)
See triage note  States she has lost a filling and also broke her tooth about 1 -1/2 weeks ago    States she is having increased pain and swelling noted to right side of face

## 2017-02-10 NOTE — Discharge Instructions (Signed)
Begin taking antibiotics as directed. Tylenol 3 one every 8 hours if needed for pain. Discontinue taking ibuprofen today. Tomorrow you may take 3 ibuprofen every 8 hours with food.  OPTIONS FOR DENTAL FOLLOW UP CARE  [place] Department of Health and Human Services - Local Safety Net Dental Clinics TripDoors.comhttp://www.ncdhhs.gov/dph/oralhealth/services/safetynetclinics.htm   [facility] [phone])  [facility] [phone])  [facility] [phone]  [facility] [phone])  [facility] [phone]) This clinic caters to the indigent population and is on a lottery system. Location: [employer], [address], [address], [place] [facility] Hours: Wednesdays from 6pm - 9pm, patients seen by a lottery system. For dates, call or go to [email or web address] Services: Cleanings, fillings and simple extractions. Payment Options: DENTAL WORK IS FREE OF CHARGE. Bring proof of income or support. Best way to get seen: Arrive at 5:15 pm - this is a lottery, NOT first come/first serve, so arriving earlier will not increase your chances of being seen.     [facility] [phone] Select option 1 for emergencies   Location: [facility], [place], [address], [place] [facility] Hours: No walk-ins accepted - call the day before to schedule an appointment. Check in times are 9:30 am and 1:30 pm. Services: Simple extractions, temporary fillings, pulpectomy/pulp debridement, uncomplicated abscess drainage. Payment Options: PAYMENT IS DUE AT THE TIME OF SERVICE.  Fee is usually $100-200, additional surgical procedures (e.g. abscess drainage) may be extra. Cash, checks, Visa/MasterCard accepted.  Can file Medicaid if patient is covered for dental - patient should call case worker to check. No discount for [facility] patients. Best way to  get seen: MUST call the day before and get onto the schedule. Can usually be seen the next 1-2 days. No walk-ins accepted.     [facility] [phone]   Location: [facility], [address], [place] [facility] Hours: M, W, Th, F 8am or 1:30pm, Tues 9a or 1:30 - first come/first served. Services: Simple extractions, temporary fillings, uncomplicated abscess drainage.  You do not need to be an [place] resident. Payment Options: PAYMENT IS DUE AT THE TIME OF SERVICE. Dental insurance, otherwise sliding scale - bring proof of income or support. Depending on income and treatment needed, cost is usually $50-200. Best way to get seen: Arrive early as it is first come/first served.     [facility] [phone]   Location: [address] Clinic Hours: Mon-Thu 8a-5p Services: Most basic dental services including extractions and fillings. Payment Options: PAYMENT IS DUE AT THE TIME OF SERVICE. Sliding scale, up to 50% off - bring proof if income or support. Medicaid with dental option accepted. Best way to get seen: Call to schedule an appointment, can usually be seen within 2 weeks OR they will try to see walk-ins - show up at 8a or 2p (you may have to wait).     [facility] [phone] [place] RESIDENTS ONLY   Location: [facility], [address], [place], [place] [id number] Clinic Hours: By appointment only. Monday - Thursday 8am-5pm, Friday 8am-12pm Services: Cleanings, fillings, extractions. Payment Options: PAYMENT IS DUE AT THE TIME OF SERVICE. Cash, Visa or MasterCard. Sliding scale - $30 minimum per service. Best way to get seen: Come in to office, complete packet and make an appointment - need proof of income or support monies for each household  member and proof of [facility] residence. Usually takes about a month to get in.     [facility] [phone]   Location: [address], [place] Clinic Hours: Walk-in Urgent Care Dental Services are offered Monday-Friday mornings only. The numbers of emergencies accepted daily is limited to the number of providers available. Maximum 15 - Mondays, Wednesdays & Thursdays Maximum 10 - Tuesdays & Fridays Services: You do not need to be a [facility] resident to be seen for a dental emergency. Emergencies are defined as pain, swelling, abnormal bleeding, or dental trauma. Walkins will receive x-rays if needed. NOTE: Dental cleaning is not an emergency. Payment Options: PAYMENT IS DUE AT THE TIME OF SERVICE. Minimum co-pay is $40.00 for uninsured patients. Minimum co-pay is $3.00 for Medicaid with dental coverage. Dental Insurance is accepted and must be presented at time of visit. Medicare does not cover dental. Forms of payment: Cash, credit card, checks. Best way to get seen: If not previously registered with the clinic, walk-in dental registration begins at 7:15 am and is on a first come/first serve basis. If previously registered with the clinic, call to make an appointment.     [facility] [place] ONLY   Location: [address], [place], [place] Clinic Hours: Mon-Thu 10a-2p Services: Extractions only! Payment Options: FREE (donations accepted) - bring proof of income or support Best way to get seen: Call and schedule an appointment OR come at 8am on the 1st Monday of every month (except for holidays) when it is first come/first served.     [facility] [phone]   Location: [place], [place] Clinic Hours: Friday mornings Services, Payment Options, Best way to get seen: Call for info
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
## 2017-02-10 NOTE — ED Triage Notes (Signed)
Pt complains of right sided dental pain for several weeks

## 2017-05-31 ENCOUNTER — Encounter: Payer: Self-pay | Admitting: *Deleted

## 2017-05-31 ENCOUNTER — Other Ambulatory Visit: Payer: Self-pay

## 2017-05-31 ENCOUNTER — Emergency Department
Admission: EM | Admit: 2017-05-31 | Discharge: 2017-06-01 | Disposition: A | Discharge status: Home / Self Care | Payer: Medicare Other | Attending: Emergency Medicine | Admitting: Emergency Medicine

## 2017-05-31 DIAGNOSIS — Z79899 Other long term (current) drug therapy: Secondary | ICD-10-CM | POA: Diagnosis not present

## 2017-05-31 DIAGNOSIS — B9689 Other specified bacterial agents as the cause of diseases classified elsewhere: Secondary | ICD-10-CM | POA: Diagnosis not present

## 2017-05-31 DIAGNOSIS — A549 Gonococcal infection, unspecified: Secondary | ICD-10-CM | POA: Diagnosis not present

## 2017-05-31 DIAGNOSIS — N730 Acute parametritis and pelvic cellulitis: Secondary | ICD-10-CM | POA: Diagnosis not present

## 2017-05-31 DIAGNOSIS — Z87891 Personal history of nicotine dependence: Secondary | ICD-10-CM | POA: Diagnosis not present

## 2017-05-31 DIAGNOSIS — N760 Acute vaginitis: Secondary | ICD-10-CM | POA: Diagnosis not present

## 2017-05-31 DIAGNOSIS — R102 Pelvic and perineal pain: Secondary | ICD-10-CM | POA: Diagnosis present

## 2017-05-31 LAB — CBC WITH DIFFERENTIAL/PLATELET
BASOS ABS: 0 10*3/uL (ref 0–0.1)
BASOS PCT: 1 %
EOS PCT: 1 %
Eosinophils Absolute: 0.1 10*3/uL (ref 0–0.7)
HCT: 38.8 % (ref 35.0–47.0)
Hemoglobin: 13.4 g/dL (ref 12.0–16.0)
LYMPHS PCT: 37 %
Lymphs Abs: 2.1 10*3/uL (ref 1.0–3.6)
MCH: 33.2 pg (ref 26.0–34.0)
MCHC: 34.5 g/dL (ref 32.0–36.0)
MCV: 96.3 fL (ref 80.0–100.0)
MONO ABS: 0.5 10*3/uL (ref 0.2–0.9)
Monocytes Relative: 9 %
NEUTROS ABS: 2.9 10*3/uL (ref 1.4–6.5)
Neutrophils Relative %: 52 %
PLATELETS: 255 10*3/uL (ref 150–440)
RBC: 4.03 MIL/uL (ref 3.80–5.20)
RDW: 13.1 % (ref 11.5–14.5)
WBC: 5.6 10*3/uL (ref 3.6–11.0)

## 2017-05-31 LAB — BASIC METABOLIC PANEL
ANION GAP: 10 (ref 5–15)
BUN: 14 mg/dL (ref 6–20)
CALCIUM: 9.6 mg/dL (ref 8.9–10.3)
CO2: 25 mmol/L (ref 22–32)
Chloride: 101 mmol/L (ref 101–111)
Creatinine, Ser: 0.96 mg/dL (ref 0.44–1.00)
Glucose, Bld: 91 mg/dL (ref 65–99)
POTASSIUM: 3.6 mmol/L (ref 3.5–5.1)
Sodium: 136 mmol/L (ref 135–145)

## 2017-05-31 LAB — WET PREP, GENITAL
Sperm: NONE SEEN
Trich, Wet Prep: NONE SEEN
YEAST WET PREP: NONE SEEN

## 2017-05-31 LAB — RAPID HIV SCREEN (HIV 1/2 AB+AG)
HIV 1/2 ANTIBODIES: NONREACTIVE
HIV-1 P24 Antigen - HIV24: NONREACTIVE

## 2017-05-31 MED ORDER — CEFTRIAXONE SODIUM 250 MG IJ SOLR
250.0000 mg | Freq: Once | INTRAMUSCULAR | Status: AC
  Administered 2017-05-31: 250 mg via INTRAMUSCULAR
  Filled 2017-05-31: qty 250

## 2017-05-31 MED ORDER — TRAMADOL HCL 50 MG PO TABS: 50.0000 mg | ORAL_TABLET | Freq: Four times a day (QID) | ORAL | 0 refills | Status: DC | PRN

## 2017-05-31 MED ORDER — MORPHINE SULFATE (PF) 4 MG/ML IV SOLN
4.0000 mg | Freq: Once | INTRAVENOUS | Status: AC
Start: 2017-05-31 — End: 2017-05-31
  Administered 2017-05-31: 4 mg via INTRAVENOUS
  Filled 2017-05-31: qty 1

## 2017-05-31 MED ORDER — DOXYCYCLINE HYCLATE 100 MG PO TABS
100.0000 mg | ORAL_TABLET | Freq: Once | ORAL | Status: AC
  Administered 2017-05-31: 100 mg via ORAL
  Filled 2017-05-31: qty 1

## 2017-05-31 MED ORDER — METRONIDAZOLE 500 MG PO TABS: 500.0000 mg | ORAL_TABLET | Freq: Two times a day (BID) | ORAL | 0 refills | Status: AC

## 2017-05-31 MED ORDER — METRONIDAZOLE 500 MG PO TABS
500.0000 mg | ORAL_TABLET | Freq: Once | ORAL | Status: AC
  Administered 2017-05-31: 500 mg via ORAL
  Filled 2017-05-31: qty 1

## 2017-05-31 MED ORDER — DOXYCYCLINE HYCLATE 100 MG PO CAPS: 100.0000 mg | ORAL_CAPSULE | Freq: Two times a day (BID) | ORAL | 0 refills | Status: DC

## 2017-05-31 MED ORDER — ONDANSETRON HCL 4 MG/2ML IJ SOLN
4.0000 mg | Freq: Once | INTRAMUSCULAR | Status: AC
  Administered 2017-05-31: 4 mg via INTRAVENOUS
  Filled 2017-05-31: qty 2

## 2017-05-31 NOTE — ED Triage Notes (Signed)
Pt presents w/ c/o pelvic pain, discharge, and bleeding. Pt states pain started yesterday. Pt states discharge that she describes as purulent that she noticed starting yesterday. Pt c/o nausea, earlier, but now is not. Pt is walking bent over.
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
## 2017-06-01 ENCOUNTER — Emergency Department: Payer: Medicare Other

## 2017-06-01 DIAGNOSIS — N730 Acute parametritis and pelvic cellulitis: Secondary | ICD-10-CM | POA: Diagnosis not present

## 2017-06-01 LAB — CHLAMYDIA/NGC RT PCR (ARMC ONLY)
CHLAMYDIA TR: NOT DETECTED
N gonorrhoeae: DETECTED — AB

## 2017-06-01 LAB — HCG, QUANTITATIVE, PREGNANCY: hCG, Beta Chain, Quant, S: <1 m[IU]/mL (ref <5)

## 2017-06-01 NOTE — ED Provider Notes (Signed)
[place] Transvaginal Non-ob  Result Date: 06/01/2017 CLINICAL DATA:  Initial evaluation for acute pelvic pain with vaginal discharge for 1 day. EXAM: TRANSABDOMINAL AND TRANSVAGINAL ULTRASOUND OF PELVIS DOPPLER ULTRASOUND OF OVARIES TECHNIQUE: Both transabdominal and transvaginal ultrasound examinations of the pelvis were performed. Transabdominal technique was performed for global imaging of the pelvis including uterus, ovaries, adnexal regions, and pelvic cul-de-sac. It was necessary to proceed with endovaginal exam following the transabdominal exam to visualize the uterus and ovaries. Color and duplex Doppler ultrasound was utilized to evaluate blood flow to the ovaries. COMPARISON:  None available. FINDINGS: Uterus Measurements: 13.4 x 5.3 x 8.6 cm. No fibroids or other mass visualized. Endometrium Thickness: 16 mm.  No focal abnormality visualized. Right ovary Measurements: 3.1 x 2.4 x 3.1 cm. 2 cm simple anechoic cyst without internal vascularity or other complexity, likely a normal physiologic cyst. Left ovary Measurements: 3.1 x 2.1 x 2.2 cm. Normal appearance/no adnexal mass. Pulsed Doppler evaluation of both ovaries demonstrates normal low-resistance arterial and venous waveforms. Other findings No abnormal free fluid. IMPRESSION: 1. No acute abnormality identified within the pelvis. No evidence for torsion. 2. 2 cm simple right ovarian cyst, most consistent with a normal physiologic cyst. No further follow-up imaging is recommended regarding this finding. Electronically Signed   By: Rise MuBenjamin  McClintock M.D.   On: 06/01/2017 01:27   [place] Pelvis Complete  Result Date: 06/01/2017 CLINICAL DATA:  Initial evaluation for acute pelvic pain with vaginal discharge for 1 day. EXAM: TRANSABDOMINAL AND TRANSVAGINAL ULTRASOUND OF PELVIS DOPPLER ULTRASOUND OF OVARIES TECHNIQUE: Both transabdominal and transvaginal ultrasound examinations of the pelvis were performed. Transabdominal technique was performed for global  imaging of the pelvis including uterus, ovaries, adnexal regions, and pelvic cul-de-sac. It was necessary to proceed with endovaginal exam following the transabdominal exam to visualize the uterus and ovaries. Color and duplex Doppler ultrasound was utilized to evaluate blood flow to the ovaries. COMPARISON:  None available. FINDINGS: Uterus Measurements: 13.4 x 5.3 x 8.6 cm. No fibroids or other mass visualized. Endometrium Thickness: 16 mm.  No focal abnormality visualized. Right ovary Measurements: 3.1 x 2.4 x 3.1 cm. 2 cm simple anechoic cyst without internal vascularity or other complexity, likely a normal physiologic cyst. Left ovary Measurements: 3.1 x 2.1 x 2.2 cm. Normal appearance/no adnexal mass. Pulsed Doppler evaluation of both ovaries demonstrates normal low-resistance arterial and venous waveforms. Other findings No abnormal free fluid. IMPRESSION: 1. No acute abnormality identified within the pelvis. No evidence for torsion. 2. 2 cm simple right ovarian cyst, most consistent with a normal physiologic cyst. No further follow-up imaging is recommended regarding this finding. Electronically Signed   By: Rise MuBenjamin  McClintock M.D.   On: 06/01/2017 01:27   [place] Art/ven Flow Abd Pelv Doppler  Result Date: 06/01/2017 CLINICAL DATA:  Initial evaluation for acute pelvic pain with vaginal discharge for 1 day. EXAM: TRANSABDOMINAL AND TRANSVAGINAL ULTRASOUND OF PELVIS DOPPLER ULTRASOUND OF OVARIES TECHNIQUE: Both transabdominal and transvaginal ultrasound examinations of the pelvis were performed. Transabdominal technique was performed for global imaging of the pelvis including uterus, ovaries, adnexal regions, and pelvic cul-de-sac. It was necessary to proceed with endovaginal exam following the transabdominal exam to visualize the uterus and ovaries. Color and duplex Doppler ultrasound was utilized to evaluate blood flow to the ovaries. COMPARISON:  None available. FINDINGS: Uterus Measurements: 13.4 x 5.3  x 8.6 cm. No fibroids or other mass visualized. Endometrium Thickness: 16 mm.  No focal abnormality visualized. Right ovary Measurements: 3.1 x 2.4 x 3.1  cm. 2 cm simple anechoic cyst without internal vascularity or other complexity, likely a normal physiologic cyst. Left ovary Measurements: 3.1 x 2.1 x 2.2 cm. Normal appearance/no adnexal mass. Pulsed Doppler evaluation of both ovaries demonstrates normal low-resistance arterial and venous waveforms. Other findings No abnormal free fluid. IMPRESSION: 1. No acute abnormality identified within the pelvis. No evidence for torsion. 2. 2 cm simple right ovarian cyst, most consistent with a normal physiologic cyst. No further follow-up imaging is recommended regarding this finding. Electronically Signed   By: Rise MuBenjamin  McClintock M.D.   On: 06/01/2017 01:27     Labs Reviewed  WET PREP, GENITAL - Abnormal; Notable for the following components:      Result Value   Clue Cells Wet Prep HPF POC PRESENT (*)    WBC, Wet Prep HPF POC MODERATE (*)    All other components within normal limits  CHLAMYDIA/NGC RT PCR ([facility] ONLY) - Abnormal; Notable for the following components:   N gonorrhoeae DETECTED (*)    All other components within normal limits  RAPID HIV SCREEN (HIV 1/2 AB+AG)  CBC WITH DIFFERENTIAL/PLATELET  BASIC METABOLIC PANEL  HCG, QUANTITATIVE, PREGNANCY  URINALYSIS, ROUTINE W REFLEX MICROSCOPIC  POC URINE PREG, ED     Clinical Course as of Jun 01 156  Mon Jun 01, 2017  0001 Assuming care from Dr. Derrill KayGoodman.  In short, Jaclyn Dougherty is a 41 y.o. female with a chief complaint of pelvic pain.  Refer to the original H&P for additional details.  The current plan of care is to follow up U/S and GC/chlamydia and reassess.   [CF]  0033 HCG, Beta Chain, Quant, S: <1 [CF]  0123 Positive, already treated by Dr. Derrill KayGoodman.  Also positive for BV, Dr. Derrill KayGoodman already prescribed Flagy. N gonorrhoeae: (!) DETECTED [CF]  0155 No acute abnormalities  on ultrasound.  I updated the patient and went over the plans for antibiotics as well as explaining the results of her positive gonorrhea test.  She understands and agrees with the plan  [CF]    Clinical Course User Index [CF] Loleta RoseForbach, Ezra Denne, MD     Final diagnoses:  PID (acute pelvic inflammatory disease)  Gonorrhea  BV (bacterial vaginosis)      Loleta RoseForbach, Lindzy Rupert, MD 06/01/17 0157

## 2017-06-01 NOTE — Discharge Instructions (Signed)
You have been seen today in the Emergency Department (ED) for pelvic pain and discharge.  Your workup today shows that you had gonorrhea, a common sexually transmitted infection (STI).  You have been treated with a one time dose medication to start treatment, and it is very important that you take the full course of your prescribed antibiotics.  Please have your partner tested for STD's and do not resume sexual activity until you and your partner have confirmed negative test results or have both been treated.  Please follow up with your doctor as soon as possible regarding today?s ED visit and your symptoms.   Return to the ED if your pain worsens, you develop a fever, or for any other symptoms that concern you.

## 2017-08-24 ENCOUNTER — Observation Stay
Admission: EM | Admit: 2017-08-24 | Discharge: 2017-08-26 | Disposition: A | Discharge status: Home / Self Care | Payer: Medicare Other | Attending: Family Medicine | Admitting: Family Medicine

## 2017-08-24 ENCOUNTER — Emergency Department: Payer: Medicare Other

## 2017-08-24 ENCOUNTER — Other Ambulatory Visit: Payer: Self-pay

## 2017-08-24 DIAGNOSIS — H9192 Unspecified hearing loss, left ear: Secondary | ICD-10-CM | POA: Insufficient documentation

## 2017-08-24 DIAGNOSIS — F319 Bipolar disorder, unspecified: Secondary | ICD-10-CM | POA: Insufficient documentation

## 2017-08-24 DIAGNOSIS — J40 Bronchitis, not specified as acute or chronic: Secondary | ICD-10-CM

## 2017-08-24 DIAGNOSIS — F4310 Post-traumatic stress disorder, unspecified: Secondary | ICD-10-CM | POA: Diagnosis not present

## 2017-08-24 DIAGNOSIS — J209 Acute bronchitis, unspecified: Secondary | ICD-10-CM | POA: Diagnosis not present

## 2017-08-24 DIAGNOSIS — F209 Schizophrenia, unspecified: Secondary | ICD-10-CM | POA: Diagnosis not present

## 2017-08-24 DIAGNOSIS — E876 Hypokalemia: Secondary | ICD-10-CM | POA: Insufficient documentation

## 2017-08-24 DIAGNOSIS — E871 Hypo-osmolality and hyponatremia: Secondary | ICD-10-CM | POA: Diagnosis not present

## 2017-08-24 DIAGNOSIS — F17290 Nicotine dependence, other tobacco product, uncomplicated: Secondary | ICD-10-CM | POA: Diagnosis not present

## 2017-08-24 DIAGNOSIS — J069 Acute upper respiratory infection, unspecified: Secondary | ICD-10-CM | POA: Insufficient documentation

## 2017-08-24 DIAGNOSIS — Z86718 Personal history of other venous thrombosis and embolism: Secondary | ICD-10-CM | POA: Insufficient documentation

## 2017-08-24 DIAGNOSIS — R0602 Shortness of breath: Secondary | ICD-10-CM | POA: Diagnosis present

## 2017-08-24 DIAGNOSIS — Z23 Encounter for immunization: Secondary | ICD-10-CM | POA: Diagnosis not present

## 2017-08-24 LAB — BASIC METABOLIC PANEL
ANION GAP: 12 (ref 5–15)
BUN: 14 mg/dL (ref 6–20)
CALCIUM: 9 mg/dL (ref 8.9–10.3)
CO2: 19 mmol/L — ABNORMAL LOW (ref 22–32)
Chloride: 101 mmol/L (ref 101–111)
Creatinine, Ser: 0.74 mg/dL (ref 0.44–1.00)
GFR calc Af Amer: >60 mL/min (ref >60)
GLUCOSE: 153 mg/dL — AB (ref 65–99)
Potassium: 3 mmol/L — ABNORMAL LOW (ref 3.5–5.1)
Sodium: 132 mmol/L — ABNORMAL LOW (ref 135–145)

## 2017-08-24 LAB — CBC
HCT: 40.9 % (ref 35.0–47.0)
Hemoglobin: 13.9 g/dL (ref 12.0–16.0)
MCH: 33 pg (ref 26.0–34.0)
MCHC: 34 g/dL (ref 32.0–36.0)
MCV: 97.3 fL (ref 80.0–100.0)
PLATELETS: 268 10*3/uL (ref 150–440)
RBC: 4.2 MIL/uL (ref 3.80–5.20)
RDW: 12.3 % (ref 11.5–14.5)
WBC: 6.2 10*3/uL (ref 3.6–11.0)

## 2017-08-24 LAB — TROPONIN I

## 2017-08-24 MED ORDER — IPRATROPIUM-ALBUTEROL 0.5-2.5 (3) MG/3ML IN SOLN
9.0000 mL | Freq: Once | RESPIRATORY_TRACT | Status: AC
  Administered 2017-08-24: 9 mL via RESPIRATORY_TRACT
  Filled 2017-08-24: qty 3

## 2017-08-24 MED ORDER — PREDNISONE 20 MG PO TABS
60.0000 mg | ORAL_TABLET | Freq: Once | ORAL | Status: AC
  Administered 2017-08-24: 60 mg via ORAL
  Filled 2017-08-24: qty 3

## 2017-08-24 MED ORDER — ALBUTEROL SULFATE (2.5 MG/3ML) 0.083% IN NEBU
5.0000 mg | INHALATION_SOLUTION | Freq: Once | RESPIRATORY_TRACT | Status: AC
  Administered 2017-08-24: 5 mg via RESPIRATORY_TRACT
  Filled 2017-08-24: qty 6

## 2017-08-24 MED ORDER — AZITHROMYCIN 500 MG PO TABS
500.0000 mg | ORAL_TABLET | Freq: Once | ORAL | Status: AC
  Administered 2017-08-24: 500 mg via ORAL
  Filled 2017-08-24: qty 1
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
## 2017-08-24 NOTE — ED Notes (Signed)
Wheezing x 3-4 days, cough x 1 week. Pt has no resp problems and is a social smoker

## 2017-08-24 NOTE — ED Triage Notes (Signed)
Pt arrives to ED via POV from home with c/o SHOB, wheezing, and productive cough x1 week. Pt denies h/x of COPD or asthma; no use of OTC medications or inhalers. No reports of chest pain, no abdominal pain; no N/V/D/HA. Pt is A&O, in NAD; RR even, regular, and unlabored.

## 2017-08-24 NOTE — ED Notes (Signed)
Pt states minimal relief from Neb tx

## 2017-08-25 ENCOUNTER — Other Ambulatory Visit: Payer: Self-pay

## 2017-08-25 DIAGNOSIS — J209 Acute bronchitis, unspecified: Secondary | ICD-10-CM | POA: Diagnosis not present

## 2017-08-25 LAB — BASIC METABOLIC PANEL
ANION GAP: 11 (ref 5–15)
BUN: 12 mg/dL (ref 6–20)
CHLORIDE: 102 mmol/L (ref 101–111)
CO2: 20 mmol/L — ABNORMAL LOW (ref 22–32)
Calcium: 9.1 mg/dL (ref 8.9–10.3)
Creatinine, Ser: 0.76 mg/dL (ref 0.44–1.00)
GFR calc Af Amer: >60 mL/min (ref >60)
GLUCOSE: 125 mg/dL — AB (ref 65–99)
POTASSIUM: 3.7 mmol/L (ref 3.5–5.1)
Sodium: 133 mmol/L — ABNORMAL LOW (ref 135–145)

## 2017-08-25 LAB — CBC
HEMATOCRIT: 39.4 % (ref 35.0–47.0)
HEMOGLOBIN: 13.4 g/dL (ref 12.0–16.0)
MCH: 32.9 pg (ref 26.0–34.0)
MCHC: 34.1 g/dL (ref 32.0–36.0)
MCV: 96.4 fL (ref 80.0–100.0)
Platelets: 280 10*3/uL (ref 150–440)
RBC: 4.09 MIL/uL (ref 3.80–5.20)
RDW: 12.6 % (ref 11.5–14.5)
WBC: 7.6 10*3/uL (ref 3.6–11.0)

## 2017-08-25 LAB — MAGNESIUM: MAGNESIUM: 1.9 mg/dL (ref 1.7–2.4)

## 2017-08-25 MED ORDER — KCL IN DEXTROSE-NACL 40-5-0.9 MEQ/L-%-% IV SOLN
INTRAVENOUS | Status: DC
Start: 2017-08-25 — End: 2017-08-26
  Administered 2017-08-25 – 2017-08-26 (×2): via INTRAVENOUS
  Filled 2017-08-25 (×8): qty 1000

## 2017-08-25 MED ORDER — ACETAMINOPHEN 325 MG PO TABS: 650.0000 mg | ORAL_TABLET | Freq: Four times a day (QID) | ORAL | Status: DC | PRN

## 2017-08-25 MED ORDER — NORETHINDRONE 0.35 MG PO TABS: 1.0000 | ORAL_TABLET | Freq: Every day | ORAL | Status: DC

## 2017-08-25 MED ORDER — CYCLOBENZAPRINE HCL 10 MG PO TABS
5.0000 mg | ORAL_TABLET | Freq: Three times a day (TID) | ORAL | Status: DC | PRN
  Administered 2017-08-25 – 2017-08-26 (×2): 5 mg via ORAL
  Filled 2017-08-25 (×2): qty 1

## 2017-08-25 MED ORDER — METHYLPREDNISOLONE SODIUM SUCC 125 MG IJ SOLR
60.0000 mg | Freq: Four times a day (QID) | INTRAMUSCULAR | Status: DC
  Administered 2017-08-25 – 2017-08-26 (×6): 60 mg via INTRAVENOUS
  Filled 2017-08-25 (×6): qty 2

## 2017-08-25 MED ORDER — QUETIAPINE FUMARATE 100 MG PO TABS
100.0000 mg | ORAL_TABLET | Freq: Every day | ORAL | Status: DC
  Filled 2017-08-25: qty 1
  Filled 2017-08-25: qty 4

## 2017-08-25 MED ORDER — POLYETHYLENE GLYCOL 3350 17 G PO PACK: 17.0000 g | PACK | Freq: Every day | ORAL | Status: DC | PRN

## 2017-08-25 MED ORDER — POTASSIUM CHLORIDE 2 MEQ/ML IV SOLN
INTRAVENOUS | Status: DC
  Filled 2017-08-25: qty 1000

## 2017-08-25 MED ORDER — CARBAMIDE PEROXIDE 6.5 % OT SOLN
5.0000 [drp] | Freq: Two times a day (BID) | OTIC | Status: DC
  Administered 2017-08-25 – 2017-08-26 (×3): 5 [drp] via OTIC
  Filled 2017-08-25: qty 15

## 2017-08-25 MED ORDER — GUAIFENESIN-CODEINE 100-10 MG/5ML PO SOLN
10.0000 mL | Freq: Four times a day (QID) | ORAL | Status: DC | PRN
  Administered 2017-08-25 – 2017-08-26 (×3): 10 mL via ORAL
  Filled 2017-08-25 (×4): qty 10

## 2017-08-25 MED ORDER — POTASSIUM CHLORIDE IN NACL 40-0.9 MEQ/L-% IV SOLN
INTRAVENOUS | Status: DC
  Administered 2017-08-25: 125 mL/h via INTRAVENOUS
  Filled 2017-08-25 (×4): qty 1000

## 2017-08-25 MED ORDER — ACETAMINOPHEN 650 MG RE SUPP: 650.0000 mg | Freq: Four times a day (QID) | RECTAL | Status: DC | PRN

## 2017-08-25 MED ORDER — IBUPROFEN 400 MG PO TABS: 400.0000 mg | ORAL_TABLET | Freq: Four times a day (QID) | ORAL | Status: DC | PRN

## 2017-08-25 MED ORDER — POTASSIUM CHLORIDE 2 MEQ/ML IV SOLN: INTRAVENOUS | Status: DC

## 2017-08-25 MED ORDER — ONDANSETRON HCL 4 MG PO TABS: 4.0000 mg | ORAL_TABLET | Freq: Four times a day (QID) | ORAL | Status: DC | PRN

## 2017-08-25 MED ORDER — ONDANSETRON HCL 4 MG/2ML IJ SOLN: 4.0000 mg | Freq: Four times a day (QID) | INTRAMUSCULAR | Status: DC | PRN

## 2017-08-25 MED ORDER — SODIUM CHLORIDE 0.9 % IV SOLN
500.0000 mg | INTRAVENOUS | Status: DC
  Filled 2017-08-25: qty 500

## 2017-08-25 MED ORDER — LAMOTRIGINE 100 MG PO TABS
200.0000 mg | ORAL_TABLET | Freq: Every day | ORAL | Status: DC
  Filled 2017-08-25: qty 2

## 2017-08-25 MED ORDER — GUAIFENESIN ER 600 MG PO TB12
600.0000 mg | ORAL_TABLET | Freq: Two times a day (BID) | ORAL | Status: DC
  Administered 2017-08-25 – 2017-08-26 (×3): 600 mg via ORAL
  Filled 2017-08-25 (×5): qty 1

## 2017-08-25 MED ORDER — IPRATROPIUM-ALBUTEROL 0.5-2.5 (3) MG/3ML IN SOLN
3.0000 mL | Freq: Four times a day (QID) | RESPIRATORY_TRACT | Status: DC
Start: 2017-08-25 — End: 2017-08-26
  Administered 2017-08-25 – 2017-08-26 (×5): 3 mL via RESPIRATORY_TRACT
  Filled 2017-08-25 (×4): qty 3

## 2017-08-25 MED ORDER — CLONAZEPAM 0.5 MG PO TABS
0.5000 mg | ORAL_TABLET | Freq: Two times a day (BID) | ORAL | Status: DC
  Administered 2017-08-25 – 2017-08-26 (×3): 0.5 mg via ORAL
  Filled 2017-08-25 (×3): qty 1

## 2017-08-25 MED ORDER — OLANZAPINE 7.5 MG PO TABS
15.0000 mg | ORAL_TABLET | Freq: Every day | ORAL | Status: DC
  Filled 2017-08-25: qty 2

## 2017-08-25 MED ORDER — ENOXAPARIN SODIUM 40 MG/0.4ML ~~LOC~~ SOLN
40.0000 mg | SUBCUTANEOUS | Status: DC
  Filled 2017-08-25: qty 0.4

## 2017-08-25 MED ORDER — NICOTINE 14 MG/24HR TD PT24
14.0000 mg | MEDICATED_PATCH | Freq: Every day | TRANSDERMAL | Status: DC
  Filled 2017-08-25: qty 1

## 2017-08-25 MED ORDER — GUAIFENESIN-DM 100-10 MG/5ML PO SYRP
5.0000 mL | ORAL_SOLUTION | ORAL | Status: DC | PRN
  Administered 2017-08-25: 5 mL via ORAL
  Filled 2017-08-25: qty 5

## 2017-08-25 MED ORDER — TRAZODONE HCL 50 MG PO TABS
50.0000 mg | ORAL_TABLET | Freq: Every day | ORAL | Status: DC
  Administered 2017-08-25: 50 mg via ORAL
  Filled 2017-08-25: qty 1

## 2017-08-25 MED ORDER — AZITHROMYCIN 250 MG PO TABS
500.0000 mg | ORAL_TABLET | Freq: Every day | ORAL | Status: DC
  Administered 2017-08-25: 500 mg via ORAL
  Filled 2017-08-25: qty 2

## 2017-08-25 MED ORDER — HYDROCODONE-ACETAMINOPHEN 5-325 MG PO TABS
1.0000 | ORAL_TABLET | Freq: Four times a day (QID) | ORAL | Status: DC | PRN
  Administered 2017-08-25 – 2017-08-26 (×5): 1 via ORAL
  Filled 2017-08-25 (×5): qty 1

## 2017-08-25 MED ORDER — INFLUENZA VAC SPLIT QUAD 0.5 ML IM SUSY
0.5000 mL | PREFILLED_SYRINGE | INTRAMUSCULAR | Status: AC
  Administered 2017-08-26: 0.5 mL via INTRAMUSCULAR
  Filled 2017-08-25: qty 0.5

## 2017-08-25 MED ORDER — BENZTROPINE MESYLATE 0.5 MG PO TABS
0.5000 mg | ORAL_TABLET | Freq: Every day | ORAL | Status: DC
  Filled 2017-08-25 (×2): qty 1

## 2017-08-25 MED ORDER — POTASSIUM CHLORIDE 20 MEQ PO PACK
40.0000 meq | PACK | Freq: Once | ORAL | Status: AC
  Administered 2017-08-25: 40 meq via ORAL
  Filled 2017-08-25: qty 2

## 2017-08-25 MED ORDER — BENZONATATE 100 MG PO CAPS
100.0000 mg | ORAL_CAPSULE | Freq: Three times a day (TID) | ORAL | Status: DC | PRN
  Administered 2017-08-25 (×2): 100 mg via ORAL
  Filled 2017-08-25 (×3): qty 1

## 2017-08-25 MED ORDER — ADULT MULTIVITAMIN W/MINERALS CH
1.0000 | ORAL_TABLET | Freq: Every day | ORAL | Status: DC
  Administered 2017-08-25 – 2017-08-26 (×2): 1 via ORAL
  Filled 2017-08-25 (×2): qty 1

## 2017-08-25 NOTE — Progress Notes (Signed)
1 acute bronchitis Exacerbated by acute viral upper respiratory tract infection Referred to the observation unit, IV Solu-Medrol with tapering as tolerated, scheduled breathing treatments, mucolytic agents, Tessalon Perles as needed, smoking cessation while in house  2 acute on chronic tobacco smoking abuse/dependency Cessation counseling ordered nicotine patch daily  3 acute hyponatremia Replete with IV fluids and check BMP in the morning  4 acute hypokalemia Replete with p.o. potassium/IV fluids, check magnesium level, BMP in the morning  5 chronic psychiatric history Stable Noted history of bipolar illness, schizophrenia, PTSD and medical records from outside hospital systems Continue home regiment  Continue above-stated plans

## 2017-08-25 NOTE — Progress Notes (Signed)
Nutrition Brief Note  Patient identified on the Malnutrition Screening Tool (MST) Report  42y/o female with h/o bipolar, PTSD, schizophrenia, DVT admitted with bronchitis.    Met with pt in room today. Pt reports poor appetite and oral intake pta r/t acute illness. Pt reports appetite improved today; pt eating 100% of meals. Pt requesting daily snacks; RD will order.   Wt Readings from Last 15 Encounters:  08/25/17 172 lb 2.9 oz (78.1 kg)  05/31/17 165 lb (74.8 kg)  02/10/17 154 lb (69.9 kg)  08/26/16 147 lb (66.7 kg)  05/17/16 150 lb (68 kg)  11/19/15 148 lb 13 oz (67.5 kg)  10/22/15 147 lb (66.7 kg)  09/08/15 176 lb 9.6 oz (80.1 kg)  08/12/15 163 lb (73.9 kg)  06/27/15 162 lb (73.5 kg)  01/21/15 144 lb (65.3 kg)  01/17/15 144 lb 4 oz (65.4 kg)  12/15/14 143 lb 1.3 oz (64.9 kg)  11/12/14 135 lb (61.2 kg)  10/19/14 140 lb (63.5 kg)    Body mass index is 27.79 kg/m. Patient meets criteria for overweight based on current BMI. Pt reports 5lb weight loss within the past week. Per chart, pt is weight stable pta.   Current diet order is regular, patient is consuming approximately 100% of meals at this time. Labs and medications reviewed.   No nutrition interventions warranted at this time. If nutrition issues arise, please consult RD.   Koleen Distance MS, RD, LDN Pager #- [phone] After Hours Pager: [phone]

## 2017-08-25 NOTE — Care Management Obs Status (Signed)
MEDICARE OBSERVATION STATUS NOTIFICATION   Patient Details  Name: Jaclyn Dougherty MRN: [id number] Date of Birth: 1975-07-21   Medicare Observation Status Notification Given:  Yes(paper copy signed and sent to HIM)    Chapman FitchBOWEN, Tabari Volkert T, RN 08/25/2017, 3:48 PM

## 2017-08-25 NOTE — Progress Notes (Signed)
PHARMACIST - PHYSICIAN COMMUNICATION DR:   Salary CONCERNING: Antibiotic IV to Oral Route Change Policy  RECOMMENDATION: This patient is receiving azithromycin by the intravenous route.  Based on criteria approved by the Pharmacy and Therapeutics Committee, the antibiotic(s) is/are being converted to the equivalent oral dose form(s).   DESCRIPTION: These criteria include:  Patient being treated for a respiratory tract infection, urinary tract infection, cellulitis or clostridium difficile associated diarrhea if on metronidazole  The patient is not neutropenic and does not exhibit a GI malabsorption state  The patient is eating (either orally or via tube) and/or has been taking other orally administered medications for a least 24 hours  The patient is improving clinically and has a Tmax < 100.5  If you have questions about this conversion, please contact the Pharmacy Department  []  ( [phone] )  [place] [x]  ( [phone] )  [facility] []  ( [phone]  [facility] []  ( [phone]  [facility] []  ( [phone] )  [facility]   

## 2017-08-25 NOTE — H&P (Signed)
[facility] [place] at [facility]   PATIENT NAME: Jaclyn Dougherty    MR#:  [id number]  DATE OF BIRTH:  December 10, 1975  DATE OF ADMISSION:  08/24/2017  PRIMARY CARE PHYSICIAN: Dan Humphreys[place], [facility]   REQUESTING/REFERRING PHYSICIAN:   CHIEF COMPLAINT:   Chief Complaint  Patient presents with  . Shortness of Breath    HISTORY OF PRESENT ILLNESS: Jaclyn Dougherty  is a 42 y.o. female with a known history per below which also includes bipolar illness, PTSD, schizophrenia, tobacco smoking abuse, presenting with 7-10-day history of shortness of breath, coughing, wheezing, rhinorrhea, chronic right ear pressure, in the emergency room patient was found to have diffuse interstitial lung markings on chest x-ray consistent with acute bronchitis, sodium 132, potassium 3, bicarb 19, noted tachycardia, tachypnea, patient evaluated bedside emergency room, no apparent distress, resting comfortably in bed, patient now being admitted for acute viral bronchitis.  PAST MEDICAL HISTORY:   Past Medical History:  Diagnosis Date  . Anemia   . Clotting disorder (HCC)   . DVT (deep venous thrombosis) (HCC) 01/2014  . Mania (HCC)   . UTI (lower urinary tract infection)     PAST SURGICAL HISTORY:  Past Surgical History:  Procedure Laterality Date  . BREAST RECONSTRUCTION Bilateral 2000   breast augumentation  . DILATION AND CURETTAGE OF UTERUS  July, 2015    miscarriage @ 10 weeks   . TONSILLECTOMY      SOCIAL HISTORY:  Social History   Tobacco Use  . Smoking status: Former Smoker    Types: Cigarettes    Last attempt to quit: 01/26/2014    Years since quitting: 3.5  . Smokeless tobacco: Never Used  . Tobacco comment: smokes electronic cigarettes  Substance Use Topics  . Alcohol use: No    Comment: social     FAMILY HISTORY: COPD  DRUG ALLERGIES: NKDA  REVIEW OF SYSTEMS:   CONSTITUTIONAL: No fever, fatigue or weakness.  EYES: No blurred or double vision.  EARS, NOSE, AND  THROAT: No tinnitus or ear pain.  RESPIRATORY: + cough, shortness of breath, wheezing no hemoptysis.  CARDIOVASCULAR: No chest pain, orthopnea, edema.  GASTROINTESTINAL: No nausea, vomiting, diarrhea or abdominal pain.  GENITOURINARY: No dysuria, hematuria.  ENDOCRINE: No polyuria, nocturia,  HEMATOLOGY: No anemia, easy bruising or bleeding SKIN: No rash or lesion. MUSCULOSKELETAL: No joint pain or arthritis.   NEUROLOGIC: No tingling, numbness, weakness.  PSYCHIATRY: No anxiety or depression.   MEDICATIONS AT HOME:  Prior to Admission medications   Medication Sig Start Date End Date Taking? Authorizing Provider  acetaminophen-codeine (TYLENOL #3) 300-30 MG tablet Take 1 tablet by mouth every 8 (eight) hours as needed for moderate pain. 02/10/17   Tommi RumpsSummers, Rhonda L, PA-C  albuterol (PROVENTIL HFA;VENTOLIN HFA) 108 (90 Base) MCG/ACT inhaler Inhale 2 puffs into the lungs every 6 (six) hours as needed for wheezing or shortness of breath. Patient not taking: Reported on 08/26/2016 05/17/16   Menshew, Charlesetta IvoryJenise V Bacon, PA-C  benztropine (COGENTIN) 0.5 MG tablet Take by mouth.    [provider]  clonazePAM (KLONOPIN) 0.5 MG tablet Take 0.5 mg by mouth 2 (two) times daily.     [provider]  cyclobenzaprine (FLEXERIL) 5 MG tablet Take 1 tablet (5 mg total) by mouth 3 (three) times daily as needed for muscle spasms. 08/26/16   Charlynne PanderYao, David Hsienta, MD  [place] 610 MG CAPS Take 3 tablets by mouth 3 (three) times daily.    [provider]  HYDROcodone-acetaminophen (  NORCO/VICODIN) 5-325 MG tablet Take 1 tablet by mouth every 6 (six) hours as needed for moderate pain. 08/26/16   Charlynne Pander, MD  lamoTRIgine (LAMICTAL) 200 MG tablet Take 200 mg by mouth daily.    [provider]  Multiple Vitamin (MULTIVITAMIN) tablet Take 1 tablet by mouth daily.    [provider]  NON FORMULARY 2 capsules 2 (two) times daily.    [provider]  norethindrone  (MICRONOR,CAMILA,ERRIN) 0.35 MG tablet Take by mouth.    [provider]  OLANZapine (ZYPREXA) 15 MG tablet Take by mouth.    [provider]  QUEtiapine (SEROQUEL) 100 MG tablet Take 100 mg by mouth at bedtime.    [provider]  traMADol (ULTRAM) 50 MG tablet Take 1 tablet (50 mg total) by mouth every 6 (six) hours as needed. 05/31/17 05/31/18  Phineas Semen, MD  traZODone (DESYREL) 100 MG tablet Take 100 mg by mouth at bedtime.     [provider]      PHYSICAL EXAMINATION:   VITAL SIGNS: Blood pressure 137/70, pulse (!) 110, temperature 98.4 F (36.9 C), temperature source Oral, resp. rate (!) 22, height 5\' 6"  (1.676 m), weight 75.8 kg (167 lb), last menstrual period 08/16/2017, SpO2 98 %, not currently breastfeeding.  GENERAL:  42 y.o.-year-old patient lying in the bed with no acute distress.  EYES: Pupils equal, round, reactive to light and accommodation. No scleral icterus. Extraocular muscles intact.  HEENT: Head atraumatic, normocephalic. Oropharynx and nasopharynx clear.  NECK:  Supple, no jugular venous distention. No thyroid enlargement, no tenderness.  LUNGS: Diminished breath sounds with rhonchi and wheezing throughout. No use of accessory muscles of respiration.  CARDIOVASCULAR: S1, S2 normal. No murmurs, rubs, or gallops.  ABDOMEN: Soft, nontender, nondistended. Bowel sounds present. No organomegaly or mass.  EXTREMITIES: No pedal edema, cyanosis, or clubbing.  NEUROLOGIC: Cranial nerves II through XII are intact. Muscle strength 5/5 in all extremities. Sensation intact. Gait not checked.  PSYCHIATRIC: The patient is alert and oriented x 3.  SKIN: No obvious rash, lesion, or ulcer.   LABORATORY PANEL:   CBC Recent Labs  Lab 08/24/17 1949  WBC 6.2  HGB 13.9  HCT 40.9  PLT 268  MCV 97.3  MCH 33.0  MCHC 34.0  RDW 12.3    ------------------------------------------------------------------------------------------------------------------  Chemistries  Recent Labs  Lab 08/24/17 1949  NA 132*  K 3.0*  CL 101  CO2 19*  GLUCOSE 153*  BUN 14  CREATININE 0.74  CALCIUM 9.0   ------------------------------------------------------------------------------------------------------------------ estimated creatinine clearance is 96.3 mL/min (by C-G formula based on SCr of 0.74 mg/dL). ------------------------------------------------------------------------------------------------------------------ No results for input(s): TSH, T4TOTAL, T3FREE, THYROIDAB in the last 72 hours.  Invalid input(s): FREET3   Coagulation profile No results for input(s): INR, PROTIME in the last 168 hours. ------------------------------------------------------------------------------------------------------------------- No results for input(s): DDIMER in the last 72 hours. -------------------------------------------------------------------------------------------------------------------  Cardiac Enzymes Recent Labs  Lab 08/24/17 1949  TROPONINI <0.03   ------------------------------------------------------------------------------------------------------------------ Invalid input(s): POCBNP  ---------------------------------------------------------------------------------------------------------------  Urinalysis    Component Value Date/Time   COLORURINE YELLOW (A) 08/17/2015 2003   APPEARANCEUR CLEAR (A) 08/17/2015 2003   APPEARANCEUR Hazy 11/24/2012 1748   LABSPEC 1.011 08/17/2015 2003   LABSPEC 1.011 11/24/2012 1748   PHURINE 6.0 08/17/2015 2003   GLUCOSEU NEGATIVE 08/17/2015 2003   GLUCOSEU Negative 11/24/2012 1748   HGBUR NEGATIVE 08/17/2015 2003   BILIRUBINUR NEGATIVE 08/17/2015 2003   BILIRUBINUR Negative 11/24/2012 1748   KETONESUR 1+ (A) 08/17/2015 2003   PROTEINUR  NEGATIVE 08/17/2015 2003   UROBILINOGEN 0.2  01/27/2015 1740   NITRITE NEGATIVE 08/17/2015 2003   LEUKOCYTESUR NEGATIVE 08/17/2015 2003   LEUKOCYTESUR Negative 11/24/2012 1748     RADIOLOGY: Dg Chest 2 View  Result Date: 08/24/2017 CLINICAL DATA:  Wheezing and dyspnea with productive cough x1 week. EXAM: CHEST - 2 VIEW COMPARISON:  None. FINDINGS: The heart size and mediastinal contours are within normal limits. Diffuse increase in interstitial lung markings likely representing bronchitic change is noted. No pneumonic consolidation, CHF, effusion or pneumothorax. The visualized skeletal structures are unremarkable. IMPRESSION: Mild diffuse increase in interstitial lung markings that may reflect acute bronchitic change, likely viral in etiology. Electronically Signed   By: Tollie Eth M.D.   On: 08/24/2017 20:13    EKG: Orders placed or performed during the hospital encounter of 08/24/17  . ED EKG  . 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 potassium/IV fluids, check magnesium level, BMP in the morning  5 chronic psychiatric history Stable Noted history of bipolar illness, schizophrenia, PTSD and medical records from outside hospital systems Continue home regiment  Disposition home on tomorrow barring any complications   All the records are reviewed and case discussed with ED provider. Management plans discussed with the patient, family and they are in agreement.  CODE STATUS: Code Status History    Date Active Date Inactive Code Status Order ID  Comments User Context   09/06/2015 12:03 09/08/2015 23:22 Full Code [id number]  Farrel Conners, CNM Inpatient   09/06/2015 08:43 09/06/2015 12:03 Full Code [id number]  Farrel Conners, CNM Inpatient   09/06/2015 07:46 09/06/2015 08:43 Full Code [id number]  Farrel Conners, CNM Inpatient   09/03/2015 23:21 09/04/2015 02:59 Full Code [id number]  Marta Antu, CNM Inpatient   08/24/2015 01:43 08/24/2015 06:51 Full Code [id number]  [place] Bing, MD Inpatient   08/17/2015 19:00 08/18/2015 15:08 Full Code [id number]  Conard Novak, MD Inpatient   08/12/2015 00:40 08/12/2015 20:03 Full Code [id number]  Farrel Conners, CNM Inpatient   08/07/2015 00:45 08/07/2015 09:08 Full Code [id number]  Marta Antu, CNM Inpatient   07/08/2015 00:50 07/08/2015 05:29 Full Code [id number]  Conard Novak, MD Inpatient   07/05/2015 21:36 07/06/2015 04:36 Full Code [id number]  Nadara Mustard, MD Inpatient   02/03/2014 17:50 02/05/2014 23:37 Full Code [id number]  Alison Murray, MD Inpatient       TOTAL TIME TAKING CARE OF THIS PATIENT: 35 minutes.    Evelena Asa Konner Saiz M.D on 08/25/2017   Between 7am to 6pm - Pager - [phone]  After 6pm go to [email or web address] - password [email or web address]  [facility] [place] [facility]  Office  [phone]  CC: Primary care physician; [facility]   Note: This dictation was prepared with Dragon dictation along with smaller phrase technology. Any transcriptional errors that result from this process are unintentional.

## 2017-08-26 DIAGNOSIS — J209 Acute bronchitis, unspecified: Secondary | ICD-10-CM | POA: Diagnosis not present

## 2017-08-26 MED ORDER — BENZONATATE 100 MG PO CAPS: 100.0000 mg | ORAL_CAPSULE | Freq: Three times a day (TID) | ORAL | 0 refills | Status: AC | PRN

## 2017-08-26 MED ORDER — PREDNISONE 10 MG PO TABS: 10.0000 mg | ORAL_TABLET | Freq: Every day | ORAL | 0 refills | Status: AC

## 2017-08-26 MED ORDER — CARBAMIDE PEROXIDE 6.5 % OT SOLN: 5.0000 [drp] | Freq: Two times a day (BID) | OTIC | 0 refills | Status: AC

## 2017-08-26 MED ORDER — GUAIFENESIN ER 600 MG PO TB12: 600.0000 mg | ORAL_TABLET | Freq: Two times a day (BID) | ORAL | 0 refills | Status: AC

## 2017-08-26 MED ORDER — IBUPROFEN 400 MG PO TABS: 400.0000 mg | ORAL_TABLET | Freq: Four times a day (QID) | ORAL | 0 refills | Status: AC | PRN

## 2017-08-26 MED ORDER — AZITHROMYCIN 250 MG PO TABS: ORAL_TABLET | ORAL | 0 refills | Status: AC

## 2017-08-26 MED ORDER — ALBUTEROL SULFATE HFA 108 (90 BASE) MCG/ACT IN AERS: 2.0000 | INHALATION_SPRAY | Freq: Four times a day (QID) | RESPIRATORY_TRACT | 0 refills | Status: AC | PRN

## 2017-08-26 MED ORDER — NICOTINE 14 MG/24HR TD PT24: 14.0000 mg | MEDICATED_PATCH | Freq: Every day | TRANSDERMAL | 0 refills | Status: AC

## 2017-08-26 NOTE — Discharge Summary (Signed)
[facility] - [place] at [facility]   PATIENT NAME: Jaclyn Dougherty    MR#:  [id number]  DATE OF BIRTH:  Dec 26, 1975  DATE OF ADMISSION:  08/24/2017 ADMITTING PHYSICIAN: Evelena Asa Alazay Leicht, MD  DATE OF DISCHARGE: No discharge date for patient encounter.  PRIMARY CARE PHYSICIAN: [place], [facility]    ADMISSION DIAGNOSIS:  Shortness of breath [R06.02] Bronchitis [J40]  DISCHARGE DIAGNOSIS:  Active Problems:   Acute bronchitis   SECONDARY DIAGNOSIS:   Past Medical History:  Diagnosis Date  . Anemia   . Clotting disorder (HCC)   . DVT (deep venous thrombosis) (HCC) 01/2014  . Mania (HCC)   . UTI (lower urinary tract infection)     HOSPITAL COURSE:  1acute bronchitis Resolved Exacerbated by acute viral upper respiratory tract infection Referred to the observation unit, treated with IV Solu-Medrol while in house, scheduled breathing treatments, mucolytic agents, Tessalon Perles as needed, smoking cessation given while in house, and patient did well   2acute on chronic tobacco smoking abuse/dependency Stable Cessation counseling ordered nicotine patch daily was given  3acute hyponatremia Repleted  4acute hypokalemia Repleted  5chronic psychiatric history Stable Noted history of bipolar illness, schizophrenia, PTSD and medical records from outside hospital systems Continued home regiment  DISCHARGE CONDITIONS:  On the day of discharge patient is afebrile, he dynamically stable, tolerating diet, ready for discharge home with appropriate follow-up, for more specific details please see chart   CONSULTS OBTAINED:    DRUG ALLERGIES:  No Known Allergies  DISCHARGE MEDICATIONS:   Allergies as of 08/26/2017   No Known Allergies     Medication List    TAKE these medications   acetaminophen-codeine 300-30 MG tablet Commonly known as:  TYLENOL #3 Take 1 tablet by mouth every 8 (eight) hours as needed for moderate pain.    albuterol 108 (90 Base) MCG/ACT inhaler Commonly known as:  PROVENTIL HFA;VENTOLIN HFA Inhale 2 puffs into the lungs every 6 (six) hours as needed for wheezing or shortness of breath.   azithromycin 250 MG tablet Commonly known as:  ZITHROMAX 1 po daily   benzonatate 100 MG capsule Commonly known as:  TESSALON Take 1 capsule (100 mg total) by mouth 3 (three) times daily as needed for cough.   carbamide peroxide 6.5 % OTIC solution Commonly known as:  DEBROX Place 5 drops into both ears 2 (two) times daily.   clonazePAM 0.5 MG tablet Commonly known as:  KLONOPIN Take 0.5 mg by mouth 2 (two) times daily.   cyclobenzaprine 5 MG tablet Commonly known as:  FLEXERIL Take 1 tablet (5 mg total) by mouth 3 (three) times daily as needed for muscle spasms.   guaiFENesin 600 MG 12 hr tablet Commonly known as:  MUCINEX Take 1 tablet (600 mg total) by mouth 2 (two) times daily.   HYDROcodone-acetaminophen 5-325 MG tablet Commonly known as:  NORCO/VICODIN Take 1 tablet by mouth every 6 (six) hours as needed for moderate pain.   ibuprofen 400 MG tablet Commonly known as:  ADVIL,MOTRIN Take 1 tablet (400 mg total) by mouth every 6 (six) hours as needed for fever or moderate pain.   nicotine 14 mg/24hr patch Commonly known as:  NICODERM CQ - dosed in mg/24 hours Place 1 patch (14 mg total) onto the skin daily. Start taking on:  08/27/2017   predniSONE 10 MG tablet Commonly known as:  DELTASONE Take 1 tablet (10 mg total) by mouth daily.   traMADol 50 MG tablet Commonly known as:  ULTRAM Take 1 tablet (50 mg total) by mouth every 6 (six) hours as needed.   traZODone 100 MG tablet Commonly known as:  DESYREL Take 100 mg by mouth at bedtime.        DISCHARGE INSTRUCTIONS:   If you experience worsening of your admission symptoms, develop shortness of breath, life threatening emergency, suicidal or homicidal thoughts you must seek medical attention immediately by calling 911 or  calling your MD immediately  if symptoms less severe.  You Must read complete instructions/literature along with all the possible adverse reactions/side effects for all the Medicines you take and that have been prescribed to you. Take any new Medicines after you have completely understood and accept all the possible adverse reactions/side effects.   Please note  You were cared for by a hospitalist during your hospital stay. If you have any questions about your discharge medications or the care you received while you were in the hospital after you are discharged, you can call the unit and asked to speak with the hospitalist on call if the hospitalist that took care of you is not available. Once you are discharged, your primary care physician will handle any further medical issues. Please note that NO REFILLS for any discharge medications will be authorized once you are discharged, as it is imperative that you return to your primary care physician (or establish a relationship with a primary care physician if you do not have one) for your aftercare needs so that they can reassess your need for medications and monitor your lab values.    Today   CHIEF COMPLAINT:   Chief Complaint  Patient presents with  . Shortness of Breath    HISTORY OF PRESENT ILLNESS:  42 y.o. female with a known history per below which also includes bipolar illness, PTSD, schizophrenia, tobacco smoking abuse, presenting with 7-10-day history of shortness of breath, coughing, wheezing, rhinorrhea, chronic right ear pressure, in the emergency room patient was found to have diffuse interstitial lung markings on chest x-ray consistent with acute bronchitis, sodium 132, potassium 3, bicarb 19, noted tachycardia, tachypnea, patient evaluated bedside emergency room, no apparent distress, resting comfortably in bed, patient now being admitted for acute viral bronchitis. VITAL SIGNS:  Blood pressure 121/67, pulse 73, temperature 98.9  F (37.2 C), temperature source Oral, resp. rate 20, height 5\' 6"  (1.676 m), weight 78.1 kg (172 lb 2.9 oz), last menstrual period 08/16/2017, SpO2 96 %, not currently breastfeeding.  I/O:    Intake/Output Summary (Last 24 hours) at 08/26/2017 1147 Last data filed at 08/26/2017 1110 Gross per 24 hour  Intake 3822 ml  Output 1550 ml  Net 2272 ml    PHYSICAL EXAMINATION:  GENERAL:  42 y.o.-year-old patient lying in the bed with no acute distress.  EYES: Pupils equal, round, reactive to light and accommodation. No scleral icterus. Extraocular muscles intact.  HEENT: Head atraumatic, normocephalic. Oropharynx and nasopharynx clear.  NECK:  Supple, no jugular venous distention. No thyroid enlargement, no tenderness.  LUNGS: Normal breath sounds bilaterally, no wheezing, rales,rhonchi or crepitation. No use of accessory muscles of respiration.  CARDIOVASCULAR: S1, S2 normal. No murmurs, rubs, or gallops.  ABDOMEN: Soft, non-tender, non-distended. Bowel sounds present. No organomegaly or mass.  EXTREMITIES: No pedal edema, cyanosis, or clubbing.  NEUROLOGIC: Cranial nerves II through XII are intact. Muscle strength 5/5 in all extremities. Sensation intact. Gait not checked.  PSYCHIATRIC: The patient is alert and oriented x 3.  SKIN: No obvious rash, lesion, or ulcer.  DATA REVIEW:   CBC Recent Labs  Lab 08/25/17 0331  WBC 7.6  HGB 13.4  HCT 39.4  PLT 280    Chemistries  Recent Labs  Lab 08/25/17 0331  NA 133*  K 3.7  CL 102  CO2 20*  GLUCOSE 125*  BUN 12  CREATININE 0.76  CALCIUM 9.1  MG 1.9    Cardiac Enzymes Recent Labs  Lab 08/24/17 1949  TROPONINI <0.03    Microbiology Results  Results for orders placed or performed during the hospital encounter of 05/31/17  Wet prep, genital     Status: Abnormal   Collection Time: 05/31/17 10:32 PM  Result Value Ref Range Status   Yeast Wet Prep HPF POC NONE SEEN NONE SEEN Final   Trich, Wet Prep NONE SEEN NONE SEEN  Final   Clue Cells Wet Prep HPF POC PRESENT (A) NONE SEEN Final   WBC, Wet Prep HPF POC MODERATE (A) NONE SEEN Final   Sperm NONE SEEN  Final    Comment: Performed at [facility], [address] [place], [place] [address]  Chlamydia/NGC rt PCR [facility] only)     Status: Abnormal   Collection Time: 05/31/17 10:32 PM  Result Value Ref Range Status   Specimen source GC/Chlam ENDOCERVICAL  Final   Chlamydia Tr NOT DETECTED NOT DETECTED Final   N gonorrhoeae DETECTED (A) NOT DETECTED Final    Comment: (NOTE) 100  This methodology has not been evaluated in pregnant women or in 200  patients with a history of hysterectomy. 300 400  This methodology will not be performed on patients less than 21  years of age. Performed at [facility], [address], [place], [place] [address]     RADIOLOGY:  Dg Chest 2 View  Result Date: 08/24/2017 CLINICAL DATA:  Wheezing and dyspnea with productive cough x1 week. EXAM: CHEST - 2 VIEW COMPARISON:  None. FINDINGS: The heart size and mediastinal contours are within normal limits. Diffuse increase in interstitial lung markings likely representing bronchitic change is noted. No pneumonic consolidation, CHF, effusion or pneumothorax. The visualized skeletal structures are unremarkable. IMPRESSION: Mild diffuse increase in interstitial lung markings that may reflect acute bronchitic change, likely viral in etiology. Electronically Signed   By: Tollie Eth M.D.   On: 08/24/2017 20:13    EKG:   Orders placed or performed during the hospital encounter of 08/24/17  . ED EKG  . ED EKG      Management plans discussed with the patient, family and they are in agreement.  CODE STATUS:     Code Status Orders  (From admission, onward)        Start     Ordered   08/25/17 0301  Full code  Continuous     08/25/17 0300    Code Status History    Date Active Date Inactive Code Status Order ID Comments User Context   09/06/2015 12:03  09/08/2015 23:22 Full Code [id number]  Farrel Conners, CNM Inpatient   09/06/2015 08:43 09/06/2015 12:03 Full Code [id number]  Farrel Conners, CNM Inpatient   09/06/2015 07:46 09/06/2015 08:43 Full Code [id number]  Farrel Conners, CNM Inpatient   09/03/2015 23:21 09/04/2015 02:59 Full Code [id number]  Marta Antu, CNM Inpatient   08/24/2015 01:43 08/24/2015 06:51 Full Code [id number]   Bing, MD Inpatient   08/17/2015 19:00 08/18/2015 15:08 Full Code [id number]  Conard Novak, MD Inpatient   08/12/2015 00:40 08/12/2015 20:03 Full Code [id number]  Farrel Conners, CNM Inpatient  08/07/2015 00:45 08/07/2015 09:08 Full Code [id number]  Marta AntuBrothers, Tamara, CNM Inpatient   07/08/2015 00:50 07/08/2015 05:29 Full Code [id number]  Conard NovakJackson, Stephen D, MD Inpatient   07/05/2015 21:36 07/06/2015 04:36 Full Code [id number]  Nadara MustardHarris, Robert P, MD Inpatient   02/03/2014 17:50 02/05/2014 23:37 Full Code [id number]  Alison Murrayevine, Alma M, MD Inpatient      TOTAL TIME TAKING CARE OF THIS PATIENT: 45 minutes.    Evelena AsaMontell D Hanna Aultman M.D on 08/26/2017 at 11:47 AM  Between 7am to 6pm - Pager - [phone]  After 6pm go to [email or web address] - password [email or web address]  [facility] [place] [facility]  Office  [phone]  CC: Primary care physician; [place]   Note: This dictation was prepared with Dragon dictation along with smaller phrase technology. Any transcriptional errors that result from this process are unintentional.

## 2017-08-26 NOTE — Progress Notes (Signed)
Per Dr. Katheren ShamsSalary discontinue cough syrup with codiene and norco. Okay for pt to drive home as she had narcotics much earlier in the shift.

## 2018-01-05 ENCOUNTER — Emergency Department: Payer: Medicare Other

## 2018-01-05 ENCOUNTER — Other Ambulatory Visit: Payer: Self-pay

## 2018-01-05 ENCOUNTER — Emergency Department
Admission: EM | Admit: 2018-01-05 | Discharge: 2018-01-05 | Disposition: A | Discharge status: Home / Self Care | Payer: Medicare Other | Attending: Student in an Organized Health Care Education/Training Program | Admitting: Student in an Organized Health Care Education/Training Program

## 2018-01-05 DIAGNOSIS — Z77120 Contact with and (suspected) exposure to mold (toxic): Secondary | ICD-10-CM

## 2018-01-05 DIAGNOSIS — Z87891 Personal history of nicotine dependence: Secondary | ICD-10-CM | POA: Diagnosis not present

## 2018-01-05 DIAGNOSIS — Z79899 Other long term (current) drug therapy: Secondary | ICD-10-CM | POA: Insufficient documentation

## 2018-01-05 NOTE — ED Triage Notes (Signed)
Mother states that pt has been exposed to mold in their home and she wants to be evaluated for mold exposure

## 2018-01-05 NOTE — ED Notes (Signed)
See triage note  Presents after being exposed to mold in her house  No cough or resp issues noted
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
## 2018-01-05 NOTE — Discharge Instructions (Addendum)
Advised to inform landlord formally in writing of your complaint.  Become familiar with prevention /remediation given in your discharge care instructions for control of environmental mold.  Follow-up with PCP as needed to discuss your weight gain and increased fatigue.  No acute pulmonary findings on your chest x-ray today.

## 2018-01-16 ENCOUNTER — Encounter: Payer: Self-pay | Admitting: Emergency Medicine

## 2018-01-16 ENCOUNTER — Emergency Department
Admission: EM | Admit: 2018-01-16 | Discharge: 2018-01-16 | Disposition: A | Discharge status: Home / Self Care | Payer: Medicare Other | Attending: Emergency Medicine | Admitting: Emergency Medicine

## 2018-01-16 ENCOUNTER — Other Ambulatory Visit: Payer: Self-pay

## 2018-01-16 DIAGNOSIS — Z79899 Other long term (current) drug therapy: Secondary | ICD-10-CM | POA: Insufficient documentation

## 2018-01-16 DIAGNOSIS — K047 Periapical abscess without sinus: Secondary | ICD-10-CM | POA: Diagnosis not present

## 2018-01-16 DIAGNOSIS — Z87891 Personal history of nicotine dependence: Secondary | ICD-10-CM | POA: Diagnosis not present

## 2018-01-16 DIAGNOSIS — K089 Disorder of teeth and supporting structures, unspecified: Secondary | ICD-10-CM | POA: Diagnosis present

## 2018-01-16 MED ORDER — AMOXICILLIN 500 MG PO CAPS
500.0000 mg | ORAL_CAPSULE | Freq: Once | ORAL | Status: AC
  Administered 2018-01-16: 500 mg via ORAL
  Filled 2018-01-16: qty 1

## 2018-01-16 MED ORDER — KETOROLAC TROMETHAMINE 10 MG PO TABS: 10.0000 mg | ORAL_TABLET | Freq: Four times a day (QID) | ORAL | 0 refills | Status: AC | PRN

## 2018-01-16 MED ORDER — LIDOCAINE VISCOUS HCL 2 % MT SOLN
15.0000 mL | Freq: Once | OROMUCOSAL | Status: AC
  Administered 2018-01-16: 15 mL via OROMUCOSAL
  Filled 2018-01-16: qty 15

## 2018-01-16 MED ORDER — KETOROLAC TROMETHAMINE 30 MG/ML IJ SOLN
30.0000 mg | Freq: Once | INTRAMUSCULAR | Status: AC
  Administered 2018-01-16: 30 mg via INTRAMUSCULAR
  Filled 2018-01-16: qty 1

## 2018-01-16 MED ORDER — FLUCONAZOLE 100 MG PO TABS: 150.0000 mg | ORAL_TABLET | Freq: Every day | ORAL | 0 refills | Status: AC

## 2018-01-16 MED ORDER — TRAMADOL HCL 50 MG PO TABS: 50.0000 mg | ORAL_TABLET | Freq: Four times a day (QID) | ORAL | 0 refills | Status: AC | PRN

## 2018-01-16 MED ORDER — AMOXICILLIN 500 MG PO CAPS: 500.0000 mg | ORAL_CAPSULE | Freq: Three times a day (TID) | ORAL | 0 refills | Status: AC

## 2018-01-16 MED ORDER — LIDOCAINE VISCOUS HCL 2 % MT SOLN: 10.0000 mL | OROMUCOSAL | 0 refills | Status: AC | PRN

## 2018-01-16 NOTE — ED Triage Notes (Signed)
Lower R dental pain x 2 days.

## 2018-01-16 NOTE — Discharge Instructions (Signed)
OPTIONS FOR DENTAL FOLLOW UP CARE ° °[place] Department of Health and Human Services - Local Safety Net Dental Clinics °http://www.ncdhhs.gov/dph/oralhealth/services/safetynetclinics.htm °  °[facility] (336-562-3123) ° °[facility] [place] (919-933-9087) ° °[facility] ([phone] ext [phone]) ° °[facility] ([phone]) ° °[facility] ([phone]) °This clinic caters to the indigent population and is on a lottery system. °Location: °[facility], [address], [place] °Clinic Hours: °Wednesdays from 6pm - 9pm, patients seen by a lottery system. °For dates, call or go to www.med.[facility].edu/[facility]/patients/Dental-[facility] °Services: °Cleanings, fillings and simple extractions. °Payment Options: °DENTAL WORK IS FREE OF CHARGE. Bring proof of income or support. °Best way to get seen: °Arrive at 5:15 pm - this is a lottery, NOT first come/first serve, so arriving earlier will not increase your chances of being seen. °  °  °[facility] °[phone] °Select option 1 for emergencies °  °Location: °[facility], [address], [place] °Clinic Hours: °No walk-ins accepted - call the day before to schedule an appointment. °Check in times are 9:30 am and 1:30 pm. °Services: °Simple extractions, temporary fillings, pulpectomy/pulp debridement, uncomplicated abscess drainage. °Payment Options: °PAYMENT IS DUE AT THE TIME OF SERVICE.  Fee is usually $100-200, additional surgical procedures (e.g. abscess drainage) may be extra. °Cash, checks, Visa/MasterCard accepted.  Can file Medicaid if patient is covered for dental - patient should call case worker to check. °No discount for [facility] patients. °Best way to get seen: °MUST call the day before and get onto the schedule. Can usually be seen the next 1-2 days. No walk-ins accepted. °  °  °[facility] °[phone] °   °Location: °[facility], [address], [place] °Clinic Hours: °M, W, Th, F 8am or 1:30pm, Tues 9a or 1:30 - first come/first served. °Services: °Simple extractions, temporary fillings, uncomplicated abscess drainage.  You do not need to be an [place] resident. °Payment Options: °PAYMENT IS DUE AT THE TIME OF SERVICE. °Dental insurance, otherwise sliding scale - bring proof of income or support. °Depending on income and treatment needed, cost is usually $50-200. °Best way to get seen: °Arrive early as it is first come/first served. °  °  °[facility] °[phone] °  °Location: °[address] °Clinic Hours: °Mon-Thu 8a-5p °Services: °Most basic dental services including extractions and fillings. °Payment Options: °PAYMENT IS DUE AT THE TIME OF SERVICE. °Sliding scale, up to 50% off - bring proof if income or support. °Medicaid with dental option accepted. °Best way to get seen: °Call to schedule an appointment, can usually be seen within 2 weeks OR they will try to see walk-ins - show up at 8a or 2p (you may have to wait). °  °  °[facility] °919-245-2435 °[place] RESIDENTS ONLY °  °Location: °[facility], [address], [place], [place] [address] °Clinic Hours: By appointment only. °Monday - Thursday 8am-5pm, Friday 8am-12pm °Services: Cleanings, fillings, extractions. °Payment Options: °PAYMENT IS DUE AT THE TIME OF SERVICE. °Cash, Visa or MasterCard. Sliding scale - $30 minimum per service. °Best way to get seen: °Come in to office, complete packet and make an appointment - need proof of income °or support monies for each household member and proof of [place] residence. °Usually takes about a month to get in. °  °  °[facility] °[phone] °  °Location: °[address]   [place] °Clinic Hours: Walk-in Urgent Care Dental Services are offered Monday-Friday  mornings only. °The numbers of emergencies accepted daily is limited to the number of °providers available. °Maximum 15 - Mondays, Wednesdays & Thursdays °Maximum 10 - Tuesdays & Fridays °Services: °You do not need to be a [place] resident to be seen for a dental emergency. °Emergencies are defined as pain, swelling, abnormal bleeding, or dental trauma. Walkins will receive x-rays if needed. °NOTE: Dental cleaning is not an emergency. °Payment Options: °PAYMENT IS DUE AT THE TIME OF SERVICE. °Minimum co-pay is $40.00 for uninsured patients. °Minimum co-pay is $3.00 for Medicaid with dental coverage. °Dental Insurance is accepted and must be presented at time of visit. °Medicare does not cover dental. °Forms of payment: Cash, credit card, checks. °Best way to get seen: °If not previously registered with the clinic, walk-in dental registration begins at 7:15 am and is on a first come/first serve basis. °If previously registered with the clinic, call to make an appointment. °  °  °[facility] °[phone] °[place] RESIDENTS ONLY °  °Location: °[address], [place],  °Clinic Hours: °Mon-Thu 10a-2p °Services: Extractions only! °Payment Options: °FREE (donations accepted) - bring proof of income or support °Best way to get seen: °Call and schedule an appointment OR come at 8am on the 1st Monday of every month (except for holidays) when it is first come/first served. °  °  °[facility] °[phone] °  °Location: °[address], [place] °Clinic Hours: °Friday mornings °Services, Payment Options, Best way to get seen: °Call for info °
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
## 2018-03-12 ENCOUNTER — Emergency Department
Admission: EM | Admit: 2018-03-12 | Discharge: 2018-03-13 | Disposition: A | Discharge status: Home / Self Care | Payer: Medicare Other | Attending: Emergency Medicine | Admitting: Emergency Medicine

## 2018-03-12 ENCOUNTER — Other Ambulatory Visit: Payer: Self-pay

## 2018-03-12 ENCOUNTER — Emergency Department: Payer: Medicare Other

## 2018-03-12 ENCOUNTER — Encounter: Payer: Self-pay | Admitting: Emergency Medicine

## 2018-03-12 DIAGNOSIS — Z79899 Other long term (current) drug therapy: Secondary | ICD-10-CM | POA: Diagnosis not present

## 2018-03-12 DIAGNOSIS — Z87891 Personal history of nicotine dependence: Secondary | ICD-10-CM | POA: Insufficient documentation

## 2018-03-12 DIAGNOSIS — I82519 Chronic embolism and thrombosis of unspecified femoral vein: Secondary | ICD-10-CM | POA: Insufficient documentation

## 2018-03-12 DIAGNOSIS — M79605 Pain in left leg: Secondary | ICD-10-CM

## 2018-03-12 DIAGNOSIS — I825Y3 Chronic embolism and thrombosis of unspecified deep veins of proximal lower extremity, bilateral: Secondary | ICD-10-CM

## 2018-03-12 DIAGNOSIS — M79604 Pain in right leg: Secondary | ICD-10-CM | POA: Diagnosis present

## 2018-03-12 LAB — CBC
HEMATOCRIT: 37 % (ref 35.0–47.0)
Hemoglobin: 13.2 g/dL (ref 12.0–16.0)
MCH: 32.9 pg (ref 26.0–34.0)
MCHC: 35.5 g/dL (ref 32.0–36.0)
MCV: 92.8 fL (ref 80.0–100.0)
PLATELETS: 246 10*3/uL (ref 150–440)
RBC: 3.99 MIL/uL (ref 3.80–5.20)
RDW: 12.7 % (ref 11.5–14.5)
WBC: 5.9 10*3/uL (ref 3.6–11.0)

## 2018-03-12 LAB — BASIC METABOLIC PANEL
Anion gap: 5 (ref 5–15)
BUN: 8 mg/dL (ref 6–20)
CALCIUM: 8.4 mg/dL — AB (ref 8.9–10.3)
CO2: 24 mmol/L (ref 22–32)
CREATININE: 0.79 mg/dL (ref 0.44–1.00)
Chloride: 111 mmol/L (ref 98–111)
GLUCOSE: 104 mg/dL — AB (ref 70–99)
Potassium: 3.6 mmol/L (ref 3.5–5.1)
Sodium: 140 mmol/L (ref 135–145)

## 2018-03-12 MED ORDER — TRAMADOL HCL 50 MG PO TABS
50.0000 mg | ORAL_TABLET | Freq: Once | ORAL | Status: AC
  Administered 2018-03-12: 50 mg via ORAL

## 2018-03-12 MED ORDER — TRAMADOL HCL 50 MG PO TABS
ORAL_TABLET | ORAL | Status: AC
  Filled 2018-03-12: qty 1

## 2018-03-12 NOTE — Discharge Instructions (Addendum)
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  Please use compression stockings and take your pain medication as needed, but only as written.  Return to the emergency department for any personally concerning symptoms.  As we discussed please follow-up with your doctor as well as vascular surgery to see if there is any further intervention required.

## 2018-03-12 NOTE — ED Triage Notes (Signed)
Pt presents via ems from home with bilateral lower leg pain since this morning. She states she had her first DVT in 2014 and has had several since then. She c/o increased swelling as well. Pt's vs wnl for ems. NAD noted.
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
## 2018-03-13 MED ORDER — TRAMADOL HCL 50 MG PO TABS: 50.0000 mg | ORAL_TABLET | Freq: Four times a day (QID) | ORAL | 0 refills | Status: AC | PRN

## 2018-03-13 MED ORDER — MEDICAL COMPRESSION STOCKINGS MISC: 0 refills | Status: AC

## 2018-03-13 NOTE — ED Notes (Signed)
Pt verbalizes understanding of discharge instructions.

## 2018-03-13 NOTE — ED Provider Notes (Signed)
Patient care assumed from Dr. Cyril Loosen.  Patient's ultrasound shows colonic crop but no acute blood clot.  I discussed this with the patient she is aware of this states she was taken off of blood thinners and was told she does not need to go back on them.  However given her chronic blood clot with now complaints of swelling in the legs we will have the patient follow-up with her primary care doctor I will also refer to vascular surgery to see if there is any intervention that may be needed at a future date.  I did recommend using compression stockings for the patient which I will prescribe as well as a short course of pain medication.  Patient agreeable to plan of care.   Minna Antis, MD 03/13/18 [phone]

## 2018-06-11 ENCOUNTER — Emergency Department: Payer: Medicare Other

## 2018-06-11 ENCOUNTER — Other Ambulatory Visit: Payer: Self-pay

## 2018-06-11 ENCOUNTER — Emergency Department
Admission: EM | Admit: 2018-06-11 | Discharge: 2018-06-11 | Disposition: A | Discharge status: Home / Self Care | Payer: Medicare Other | Attending: Student in an Organized Health Care Education/Training Program | Admitting: Student in an Organized Health Care Education/Training Program

## 2018-06-11 ENCOUNTER — Encounter: Payer: Self-pay | Admitting: Emergency Medicine

## 2018-06-11 DIAGNOSIS — M25561 Pain in right knee: Secondary | ICD-10-CM | POA: Diagnosis present

## 2018-06-11 DIAGNOSIS — Z79899 Other long term (current) drug therapy: Secondary | ICD-10-CM | POA: Diagnosis not present

## 2018-06-11 DIAGNOSIS — J40 Bronchitis, not specified as acute or chronic: Secondary | ICD-10-CM

## 2018-06-11 DIAGNOSIS — F17210 Nicotine dependence, cigarettes, uncomplicated: Secondary | ICD-10-CM | POA: Diagnosis not present

## 2018-06-11 DIAGNOSIS — I82513 Chronic embolism and thrombosis of femoral vein, bilateral: Secondary | ICD-10-CM | POA: Insufficient documentation

## 2018-06-11 DIAGNOSIS — R05 Cough: Secondary | ICD-10-CM | POA: Insufficient documentation

## 2018-06-11 LAB — CBC
HEMATOCRIT: 37.7 % (ref 36.0–46.0)
HEMOGLOBIN: 12.4 g/dL (ref 12.0–15.0)
MCH: 31.9 pg (ref 26.0–34.0)
MCHC: 32.9 g/dL (ref 30.0–36.0)
MCV: 96.9 fL (ref 80.0–100.0)
Platelets: 251 10*3/uL (ref 150–400)
RBC: 3.89 MIL/uL (ref 3.87–5.11)
RDW: 14.3 % (ref 11.5–15.5)
WBC: 4.7 10*3/uL (ref 4.0–10.5)
nRBC: 0 % (ref 0.0–0.2)

## 2018-06-11 LAB — BASIC METABOLIC PANEL
Anion gap: 11 (ref 5–15)
BUN: 12 mg/dL (ref 6–20)
CHLORIDE: 104 mmol/L (ref 98–111)
CO2: 22 mmol/L (ref 22–32)
CREATININE: 0.73 mg/dL (ref 0.44–1.00)
Calcium: 8.8 mg/dL — ABNORMAL LOW (ref 8.9–10.3)
GFR calc Af Amer: >60 mL/min (ref >60)
GFR calc non Af Amer: >60 mL/min (ref >60)
Glucose, Bld: 106 mg/dL — ABNORMAL HIGH (ref 70–99)
POTASSIUM: 3.9 mmol/L (ref 3.5–5.1)
Sodium: 137 mmol/L (ref 135–145)

## 2018-06-11 LAB — TROPONIN I: Troponin I: <0.03 ng/mL (ref <0.03)

## 2018-06-11 MED ORDER — ALBUTEROL SULFATE (2.5 MG/3ML) 0.083% IN NEBU
5.0000 mg | INHALATION_SOLUTION | Freq: Once | RESPIRATORY_TRACT | Status: AC
  Administered 2018-06-11: 5 mg via RESPIRATORY_TRACT
  Filled 2018-06-11: qty 6

## 2018-06-11 MED ORDER — HYDROCODONE-ACETAMINOPHEN 5-325 MG PO TABS: 1.0000 | ORAL_TABLET | ORAL | 0 refills | Status: AC | PRN

## 2018-06-11 MED ORDER — LORAZEPAM 1 MG PO TABS
1.0000 mg | ORAL_TABLET | Freq: Once | ORAL | Status: AC
  Administered 2018-06-11: 1 mg via ORAL
  Filled 2018-06-11: qty 1

## 2018-06-11 MED ORDER — ALBUTEROL SULFATE HFA 108 (90 BASE) MCG/ACT IN AERS: 2.0000 | INHALATION_SPRAY | Freq: Four times a day (QID) | RESPIRATORY_TRACT | 2 refills | Status: AC | PRN

## 2018-06-11 MED ORDER — DOXYCYCLINE HYCLATE 100 MG PO TABS: 100.0000 mg | ORAL_TABLET | Freq: Two times a day (BID) | ORAL | 0 refills | Status: AC

## 2018-06-11 MED ORDER — HYDROCODONE-ACETAMINOPHEN 5-325 MG PO TABS
2.0000 | ORAL_TABLET | Freq: Once | ORAL | Status: AC
  Administered 2018-06-11: 2 via ORAL
  Filled 2018-06-11: qty 2

## 2018-06-11 MED ORDER — ALBUTEROL SULFATE (2.5 MG/3ML) 0.083% IN NEBU
INHALATION_SOLUTION | RESPIRATORY_TRACT | Status: AC
Start: 2018-06-11 — End: 2018-06-11
  Administered 2018-06-11: 5 mg via RESPIRATORY_TRACT
  Filled 2018-06-11: qty 3

## 2018-06-11 MED ORDER — IPRATROPIUM-ALBUTEROL 0.5-2.5 (3) MG/3ML IN SOLN
3.0000 mL | Freq: Once | RESPIRATORY_TRACT | Status: AC
  Administered 2018-06-11: 3 mL via RESPIRATORY_TRACT
  Filled 2018-06-11: qty 3

## 2018-06-11 MED ORDER — APIXABAN 5 MG PO TABS: 5.0000 mg | ORAL_TABLET | Freq: Two times a day (BID) | ORAL | 0 refills | Status: AC
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
## 2018-06-11 NOTE — ED Triage Notes (Signed)
Pt to ED from home via EMS c/o right leg swelling and pain specifically around right knee and behind.  Denies blood thinners, denies birth control.  Pt with hx of chronic DVTs. Denies recent long travels.  Pt states SOB and worse with lying down.  Productive yellow cough.  Pt speaking in full complete sentences, chest rise even and unlabored, in NAD at this time.

## 2018-06-11 NOTE — ED Notes (Signed)
First Nurse Note: Patient to ED via [facility] with complaint of right knee pain, Hx of DVT, patient states she is not on blood thinners.  VS at scene: Temp 98.6, 168/90, HR 100, 98% on room air.  10/10 pain.

## 2018-06-11 NOTE — ED Notes (Signed)
Pt demanding breathing treatment in triage states "are you not going to give me a breathing treatment, I'm wheezing!"  No obvious wheezing noted in triage, chest rise even and unlabored, lungs clear bilaterally.

## 2018-06-11 NOTE — ED Notes (Signed)
EDP aware that pt pain continues.

## 2019-05-08 IMAGING — CR DG CHEST 2V
1 series · 2 of 2 positions shown · non-contrast
Comparison: None.

CLINICAL DATA: Wheezing and dyspnea with productive cough x1 week.

EXAM:
CHEST - 2 VIEW

[Series 1: dg chest 2 view · 0.14mm/px · 2 of 2 slices shown]
[im 1/2]
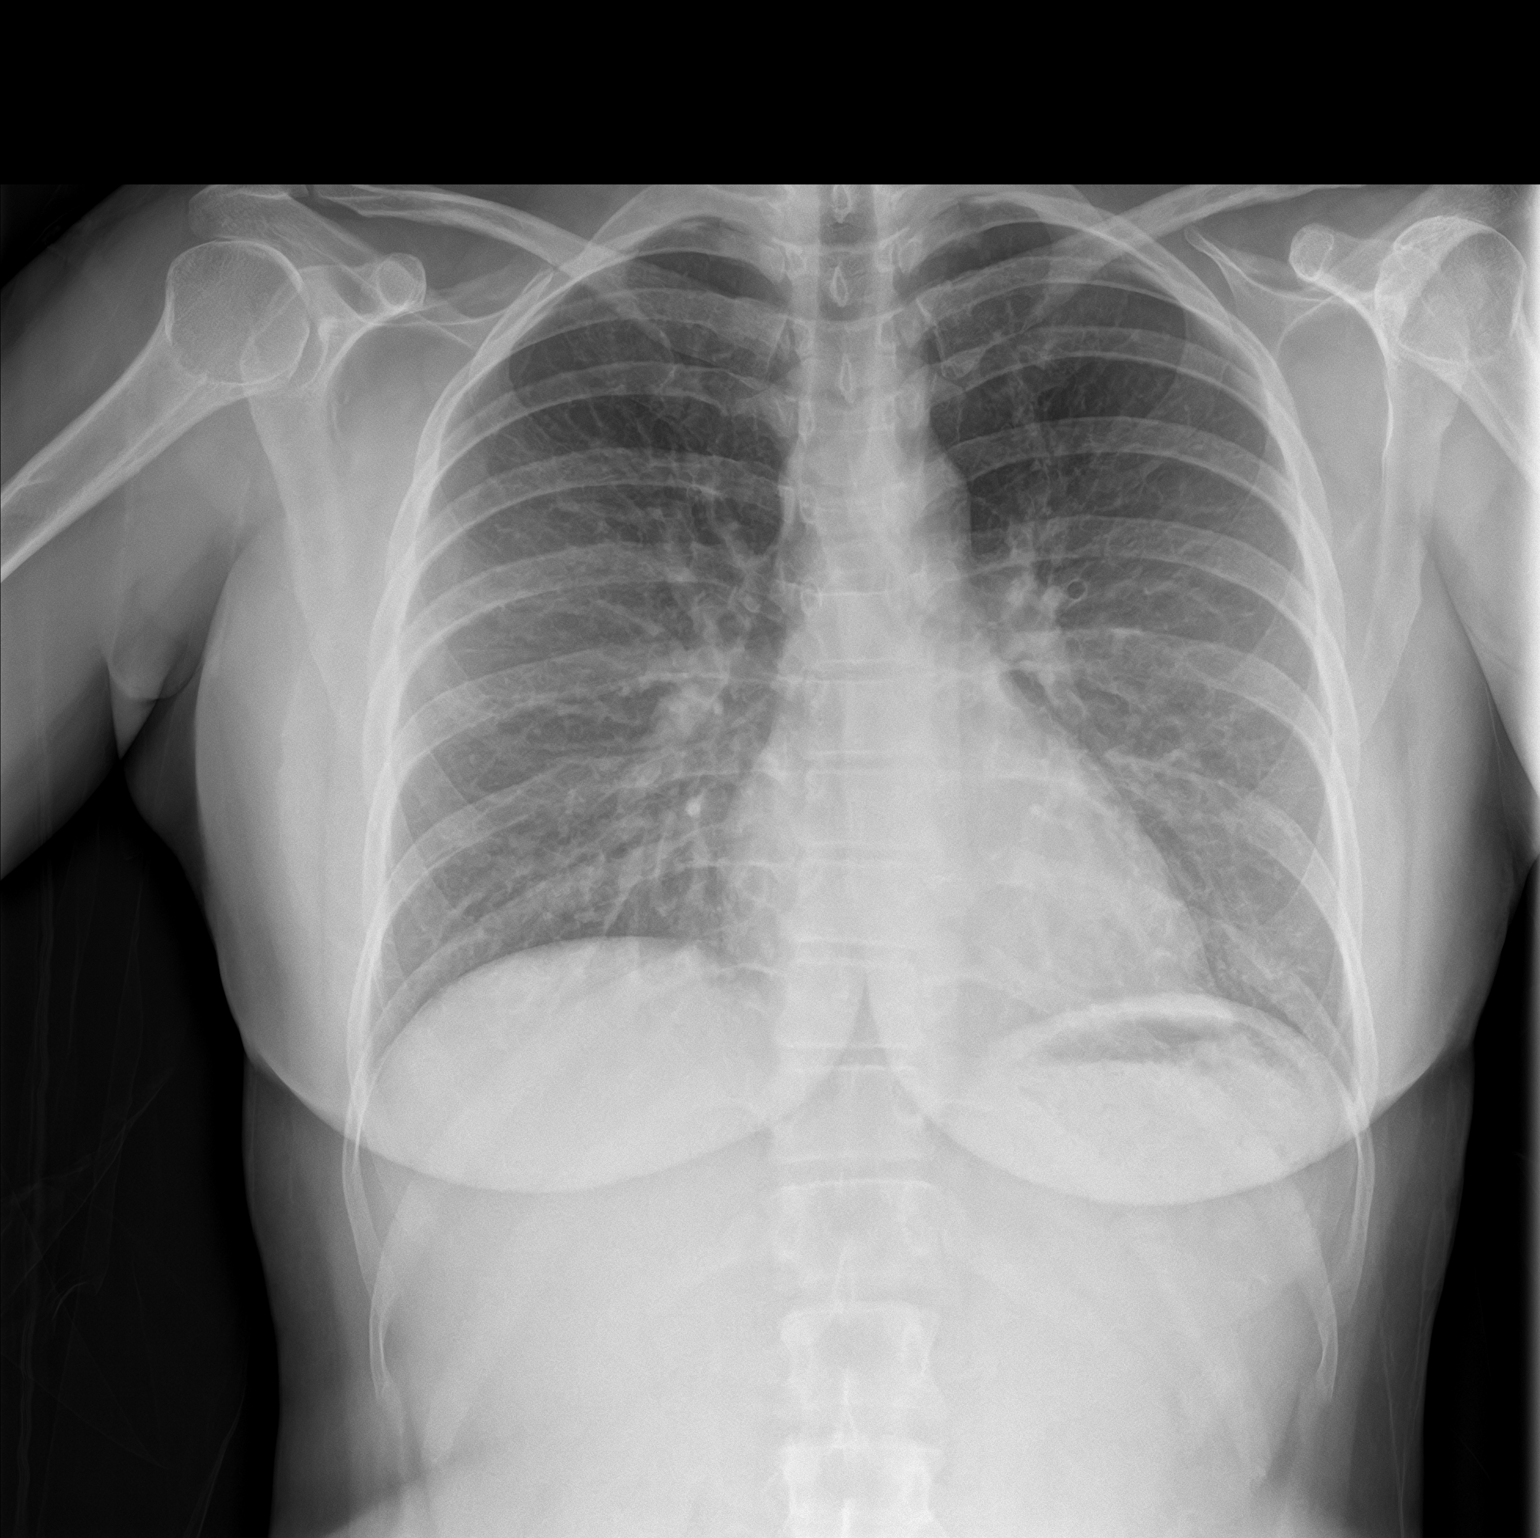
[im 2/2]
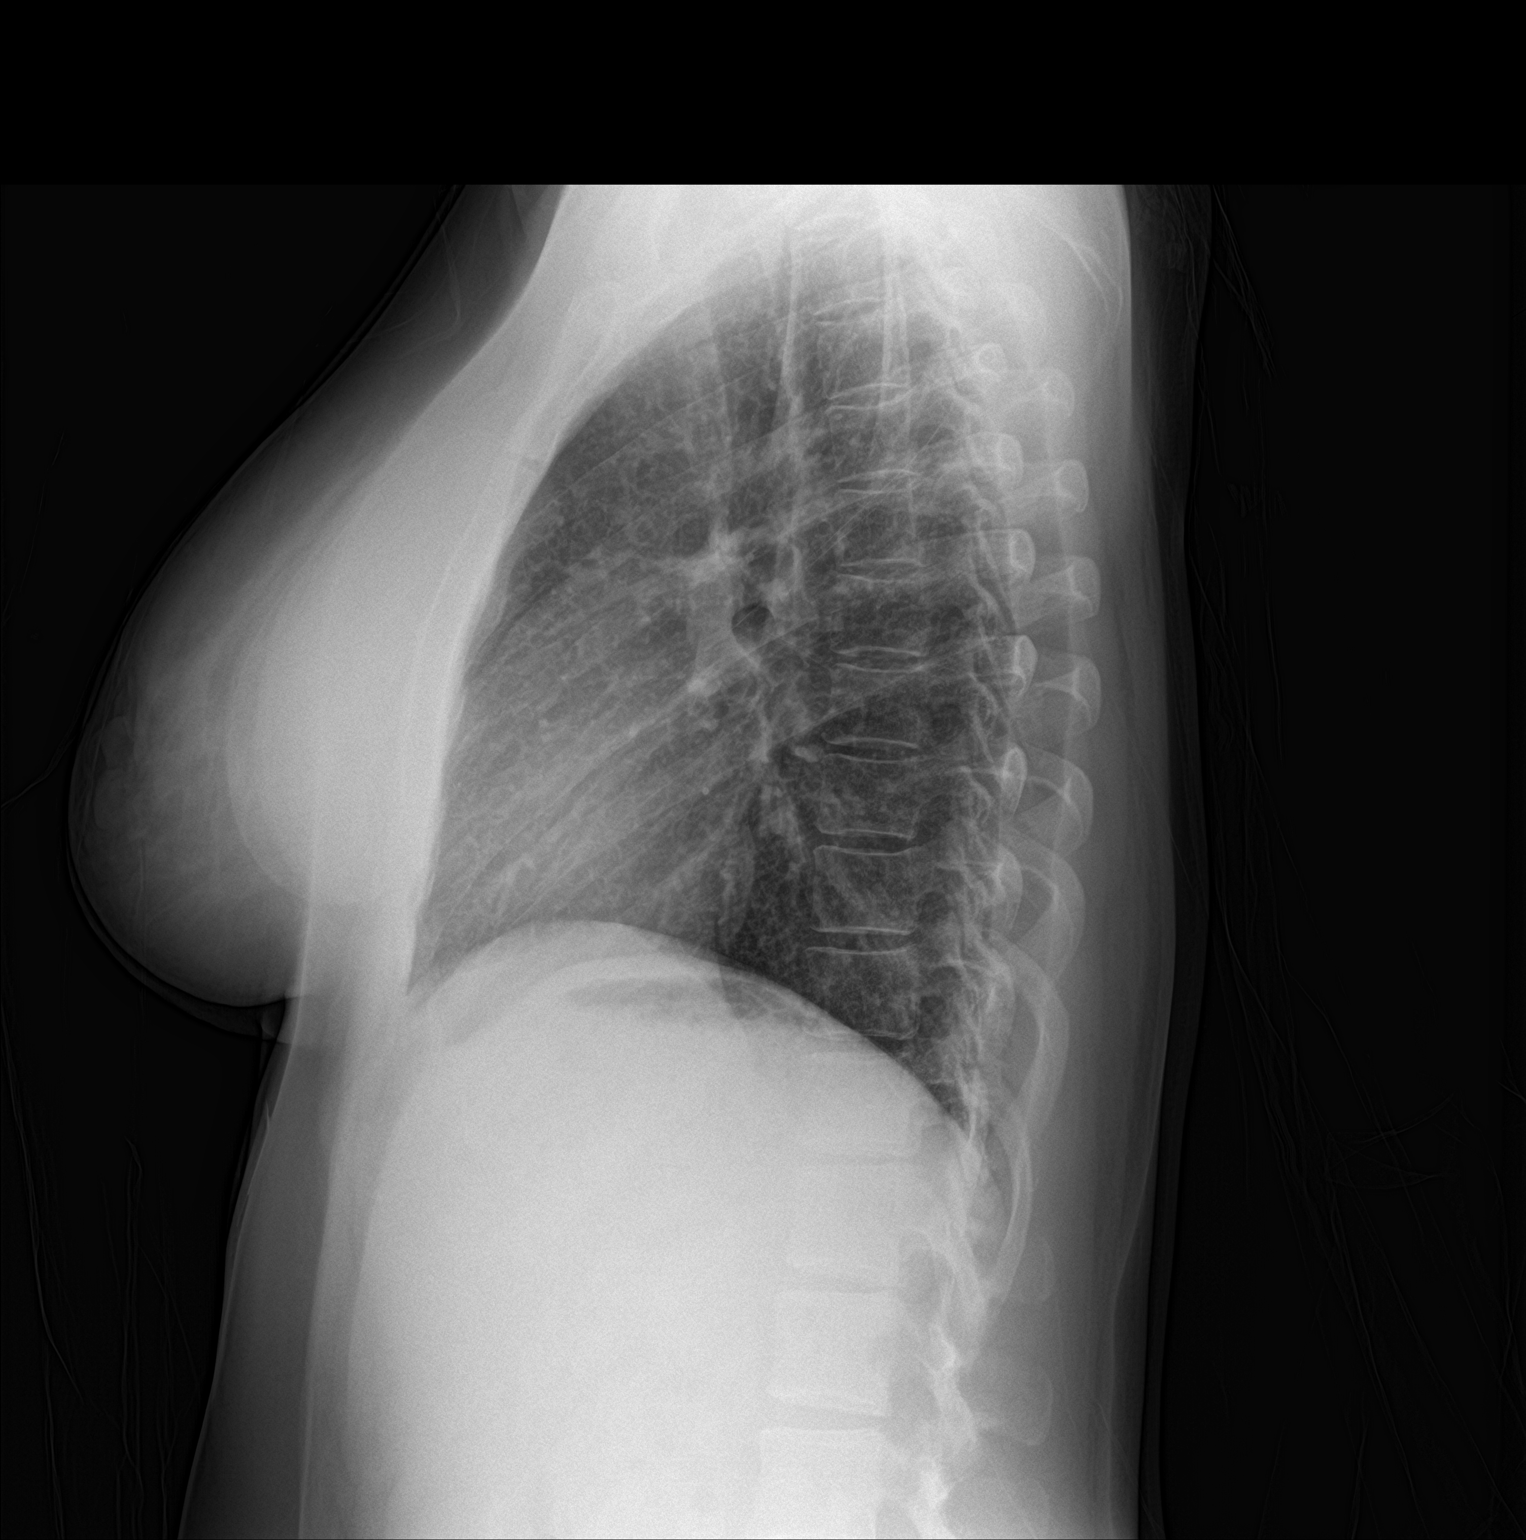

[2 of 2 positions shown; findings below may reference images not displayed]

FINDINGS: The heart size and mediastinal contours are within normal limits.
Diffuse increase in interstitial lung markings likely representing
bronchitic change is noted. No pneumonic consolidation, CHF,
effusion or pneumothorax. The visualized skeletal structures are
unremarkable.
IMPRESSION: Mild diffuse increase in interstitial lung markings that may reflect
acute bronchitic change, likely viral in etiology.

## 2019-09-19 IMAGING — CR DG CHEST 2V
2 series · 2 of 2 positions shown · non-contrast
Comparison: Radiographs August 24, 2017.

CLINICAL DATA: Chronic cough.

EXAM:
CHEST - 2 VIEW

[chest pa]
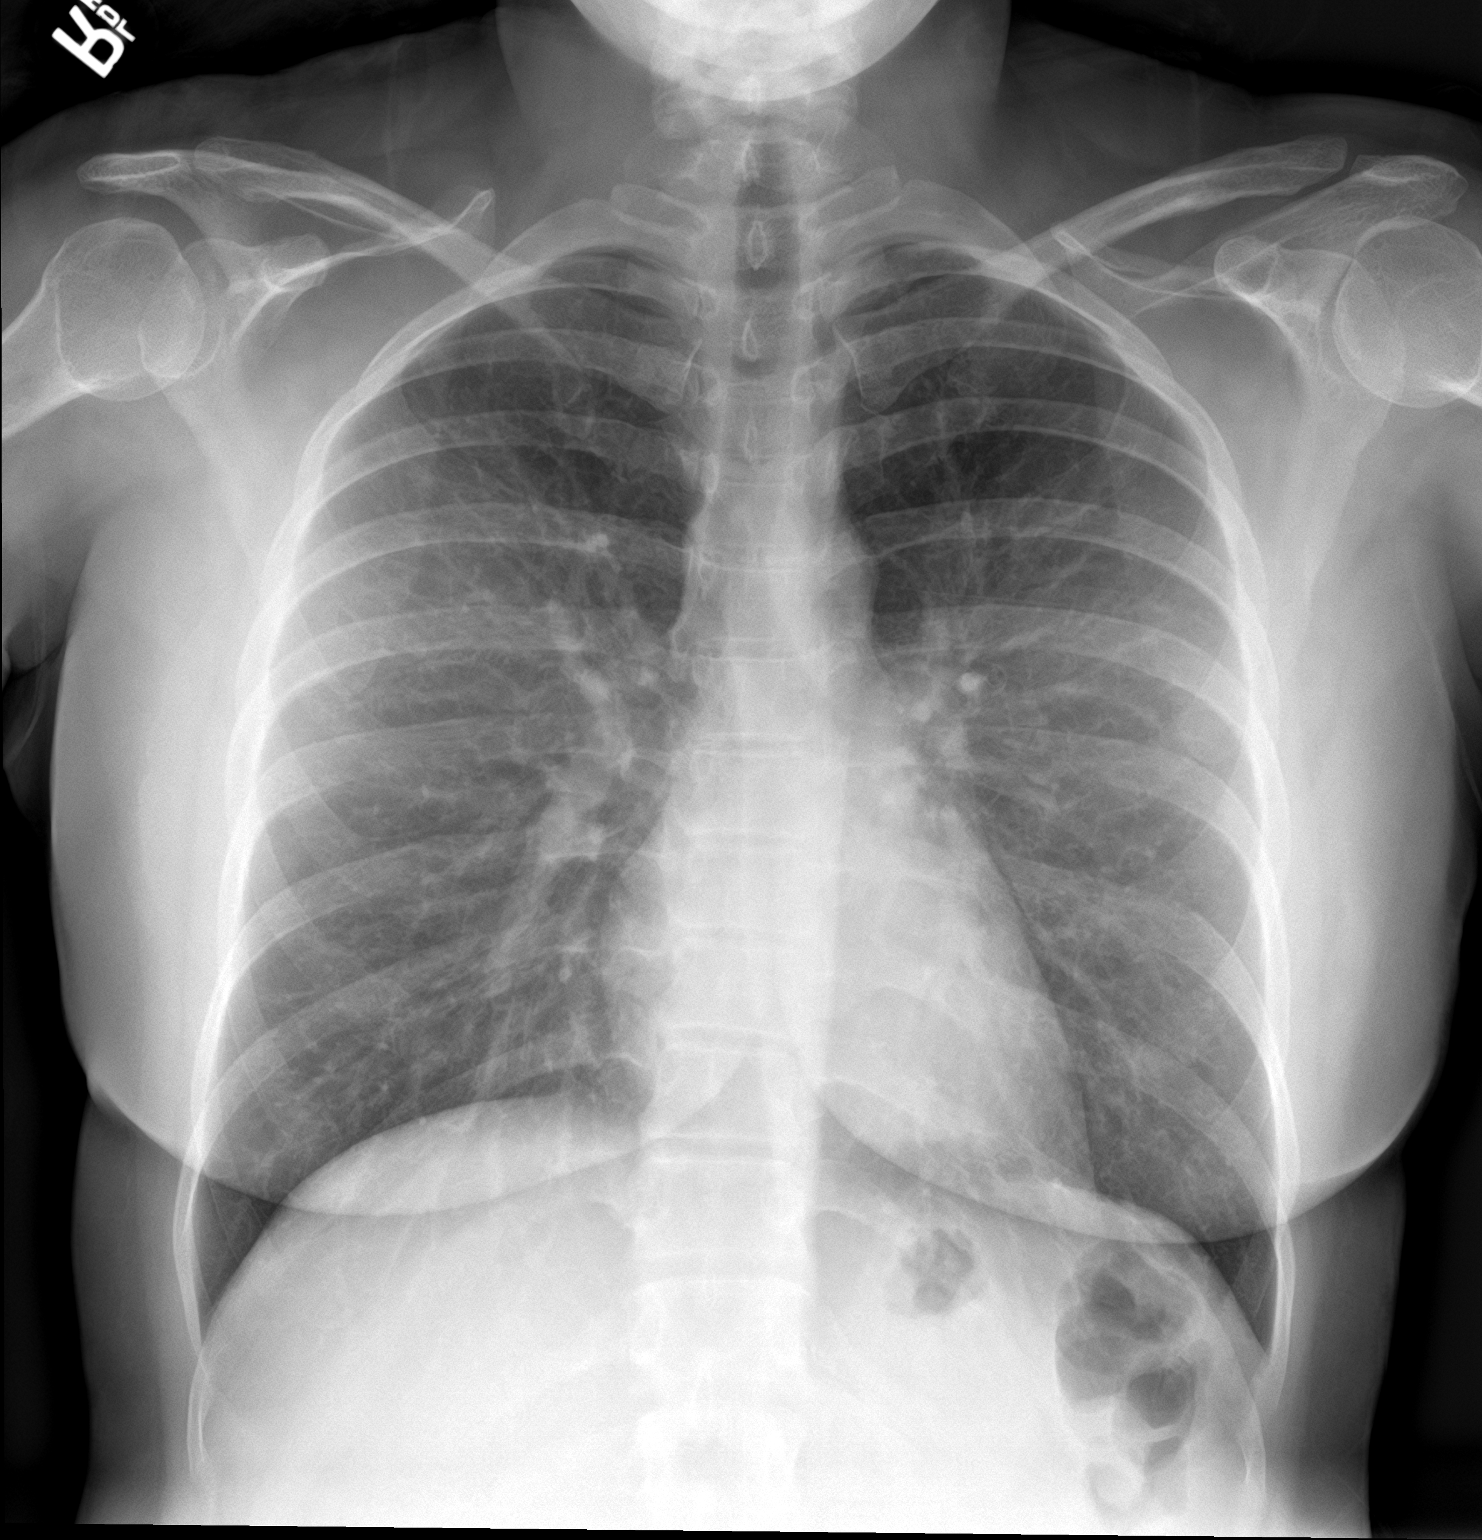

[chest lat]
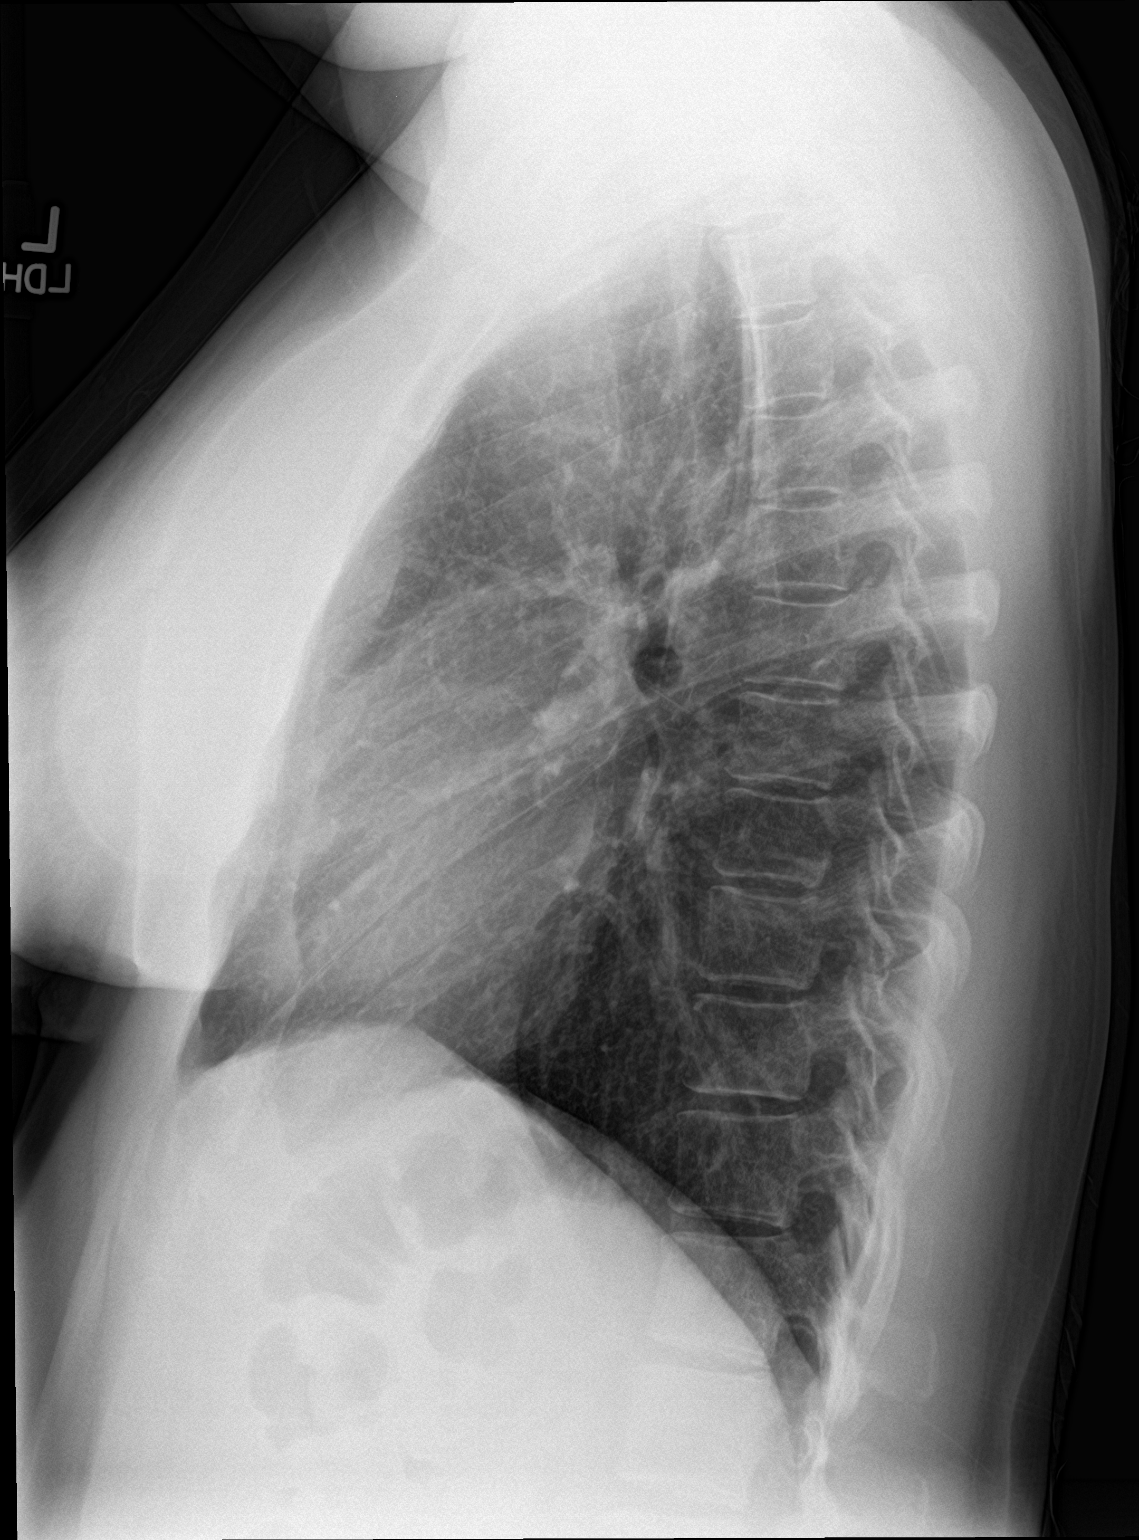

[2 of 2 positions shown; findings below may reference images not displayed]

FINDINGS: The heart size and mediastinal contours are within normal limits.
Both lungs are clear. No pneumothorax or pleural effusion is noted.
The visualized skeletal structures are unremarkable.
IMPRESSION: No active cardiopulmonary disease.
